# Patient Record
Sex: Male | Born: 1945 | Race: White | Hispanic: No | Marital: Married | Smoking: Never smoker
Health system: Southern US, Community
[De-identification: ages and names within clinical notes are randomized; demographics above are authoritative.]

## PROBLEM LIST (undated history)

## (undated) DIAGNOSIS — K219 Gastro-esophageal reflux disease without esophagitis: Secondary | ICD-10-CM

## (undated) DIAGNOSIS — R569 Unspecified convulsions: Secondary | ICD-10-CM

## (undated) DIAGNOSIS — I10 Essential (primary) hypertension: Secondary | ICD-10-CM

## (undated) DIAGNOSIS — E785 Hyperlipidemia, unspecified: Secondary | ICD-10-CM

## (undated) DIAGNOSIS — E119 Type 2 diabetes mellitus without complications: Secondary | ICD-10-CM

## (undated) HISTORY — PX: TONSILLECTOMY: SUR1361

---

## 2006-02-24 HISTORY — PX: CHOLECYSTECTOMY: SHX55

## 2008-08-18 ENCOUNTER — Encounter: Admission: RE | Admit: 2008-08-18 | Discharge: 2008-08-18 | Payer: Self-pay | Admitting: Neurology

## 2012-04-12 ENCOUNTER — Other Ambulatory Visit: Payer: Self-pay | Admitting: Neurology

## 2012-04-12 DIAGNOSIS — G40909 Epilepsy, unspecified, not intractable, without status epilepticus: Secondary | ICD-10-CM

## 2012-05-05 ENCOUNTER — Ambulatory Visit
Admission: RE | Admit: 2012-05-05 | Discharge: 2012-05-05 | Disposition: A | Discharge status: Home / Self Care | Payer: PRIVATE HEALTH INSURANCE | Source: Ambulatory Visit | Attending: Neurology | Admitting: Neurology

## 2012-05-05 DIAGNOSIS — G40909 Epilepsy, unspecified, not intractable, without status epilepticus: Secondary | ICD-10-CM

## 2012-11-08 ENCOUNTER — Inpatient Hospital Stay (HOSPITAL_COMMUNITY)
Admission: EM | Admit: 2012-11-08 | Discharge: 2012-11-10 | DRG: 872 | Disposition: A | Discharge status: Home / Self Care | Payer: 59 | Attending: Internal Medicine | Admitting: Internal Medicine

## 2012-11-08 ENCOUNTER — Emergency Department (HOSPITAL_COMMUNITY): Payer: 59

## 2012-11-08 ENCOUNTER — Encounter (HOSPITAL_COMMUNITY): Payer: Self-pay | Admitting: *Deleted

## 2012-11-08 DIAGNOSIS — D72829 Elevated white blood cell count, unspecified: Secondary | ICD-10-CM | POA: Diagnosis present

## 2012-11-08 DIAGNOSIS — J40 Bronchitis, not specified as acute or chronic: Secondary | ICD-10-CM | POA: Diagnosis present

## 2012-11-08 DIAGNOSIS — E872 Acidosis, unspecified: Secondary | ICD-10-CM | POA: Diagnosis present

## 2012-11-08 DIAGNOSIS — Z7982 Long term (current) use of aspirin: Secondary | ICD-10-CM

## 2012-11-08 DIAGNOSIS — R6510 Systemic inflammatory response syndrome (SIRS) of non-infectious origin without acute organ dysfunction: Secondary | ICD-10-CM | POA: Diagnosis present

## 2012-11-08 DIAGNOSIS — R000 Tachycardia, unspecified: Secondary | ICD-10-CM

## 2012-11-08 DIAGNOSIS — E119 Type 2 diabetes mellitus without complications: Secondary | ICD-10-CM | POA: Diagnosis present

## 2012-11-08 DIAGNOSIS — R0789 Other chest pain: Secondary | ICD-10-CM

## 2012-11-08 DIAGNOSIS — I498 Other specified cardiac arrhythmias: Secondary | ICD-10-CM | POA: Diagnosis present

## 2012-11-08 DIAGNOSIS — I10 Essential (primary) hypertension: Secondary | ICD-10-CM

## 2012-11-08 DIAGNOSIS — A419 Sepsis, unspecified organism: Principal | ICD-10-CM | POA: Diagnosis present

## 2012-11-08 DIAGNOSIS — Z79899 Other long term (current) drug therapy: Secondary | ICD-10-CM

## 2012-11-08 HISTORY — DX: Type 2 diabetes mellitus without complications: E11.9

## 2012-11-08 HISTORY — DX: Essential (primary) hypertension: I10

## 2012-11-08 LAB — CBC
HCT: 40.9 % (ref 39.0–52.0)
Hemoglobin: 15 g/dL (ref 13.0–17.0)
MCH: 32.3 pg (ref 26.0–34.0)
MCHC: 36.7 g/dL — ABNORMAL HIGH (ref 30.0–36.0)
Platelets: 285 10*3/uL (ref 150–400)
RBC: 4.68 MIL/uL (ref 4.22–5.81)
RBC: 4.34 MIL/uL (ref 4.22–5.81)
WBC: 21.6 10*3/uL — ABNORMAL HIGH (ref 4.0–10.5)

## 2012-11-08 LAB — TROPONIN I
Troponin I: <0.3 ng/mL (ref <0.30)
Troponin I: <0.3 ng/mL (ref <0.30)

## 2012-11-08 LAB — CREATININE, SERUM
Creatinine, Ser: 0.83 mg/dL (ref 0.50–1.35)
GFR calc non Af Amer: >90 mL/min (ref >90)

## 2012-11-08 LAB — URINALYSIS, ROUTINE W REFLEX MICROSCOPIC
Glucose, UA: 250 mg/dL — AB
Hgb urine dipstick: NEGATIVE
Ketones, ur: 40 mg/dL — AB
Protein, ur: NEGATIVE mg/dL

## 2012-11-08 LAB — MRSA PCR SCREENING: MRSA by PCR: NEGATIVE

## 2012-11-08 LAB — COMPREHENSIVE METABOLIC PANEL
Albumin: 4.2 g/dL (ref 3.5–5.2)
Alkaline Phosphatase: 80 U/L (ref 39–117)
BUN: 16 mg/dL (ref 6–23)
Chloride: 96 mEq/L (ref 96–112)
GFR calc Af Amer: >90 mL/min (ref >90)
Glucose, Bld: 208 mg/dL — ABNORMAL HIGH (ref 70–99)
Potassium: 5.1 mEq/L (ref 3.5–5.1)
Total Bilirubin: 0.4 mg/dL (ref 0.3–1.2)

## 2012-11-08 LAB — URINE MICROSCOPIC-ADD ON

## 2012-11-08 LAB — SEDIMENTATION RATE: Sed Rate: 5 mm/hr (ref 0–16)

## 2012-11-08 LAB — MAGNESIUM: Magnesium: 1.6 mg/dL (ref 1.5–2.5)

## 2012-11-08 MED ORDER — SODIUM CHLORIDE 0.9 % IV SOLN
INTRAVENOUS | Status: DC
  Administered 2012-11-08: 100 mL/h via INTRAVENOUS
  Administered 2012-11-09 – 2012-11-10 (×3): via INTRAVENOUS

## 2012-11-08 MED ORDER — GI COCKTAIL ~~LOC~~
30.0000 mL | Freq: Four times a day (QID) | ORAL | Status: DC | PRN
  Filled 2012-11-08: qty 30

## 2012-11-08 MED ORDER — FENTANYL CITRATE 0.05 MG/ML IJ SOLN
25.0000 ug | Freq: Once | INTRAMUSCULAR | Status: AC
  Administered 2012-11-08: 25 ug via INTRAVENOUS
  Filled 2012-11-08: qty 2

## 2012-11-08 MED ORDER — INSULIN ASPART 100 UNIT/ML ~~LOC~~ SOLN
0.0000 [IU] | Freq: Three times a day (TID) | SUBCUTANEOUS | Status: DC
  Administered 2012-11-08: 5 [IU] via SUBCUTANEOUS
  Administered 2012-11-09 (×2): 3 [IU] via SUBCUTANEOUS
  Administered 2012-11-10: 5 [IU] via SUBCUTANEOUS

## 2012-11-08 MED ORDER — HEPARIN SODIUM (PORCINE) 5000 UNIT/ML IJ SOLN
5000.0000 [IU] | Freq: Three times a day (TID) | INTRAMUSCULAR | Status: DC
Start: 2012-11-08 — End: 2012-11-10
  Administered 2012-11-08 – 2012-11-10 (×6): 5000 [IU] via SUBCUTANEOUS
  Filled 2012-11-08 (×9): qty 1

## 2012-11-08 MED ORDER — SODIUM CHLORIDE 0.9 % IV BOLUS (SEPSIS)
500.0000 mL | Freq: Once | INTRAVENOUS | Status: AC
  Administered 2012-11-08: 500 mL via INTRAVENOUS

## 2012-11-08 MED ORDER — SODIUM CHLORIDE 0.9 % IV BOLUS (SEPSIS)
1000.0000 mL | Freq: Once | INTRAVENOUS | Status: AC
  Administered 2012-11-08: 1000 mL via INTRAVENOUS

## 2012-11-08 MED ORDER — DEXTROSE 5 % IV SOLN
1.0000 g | Freq: Once | INTRAVENOUS | Status: AC
  Administered 2012-11-08: 1 g via INTRAVENOUS
  Filled 2012-11-08: qty 10

## 2012-11-08 MED ORDER — PANTOPRAZOLE SODIUM 40 MG PO TBEC
80.0000 mg | DELAYED_RELEASE_TABLET | Freq: Every day | ORAL | Status: DC
  Administered 2012-11-08 – 2012-11-09 (×2): 80 mg via ORAL
  Filled 2012-11-08 (×2): qty 2

## 2012-11-08 MED ORDER — VANCOMYCIN HCL IN DEXTROSE 1-5 GM/200ML-% IV SOLN
1000.0000 mg | Freq: Two times a day (BID) | INTRAVENOUS | Status: DC
  Administered 2012-11-09: 1000 mg via INTRAVENOUS
  Filled 2012-11-08 (×2): qty 200

## 2012-11-08 MED ORDER — PIPERACILLIN-TAZOBACTAM 3.375 G IVPB
3.3750 g | Freq: Three times a day (TID) | INTRAVENOUS | Status: DC
  Administered 2012-11-08 – 2012-11-09 (×2): 3.375 g via INTRAVENOUS
  Filled 2012-11-08 (×4): qty 50

## 2012-11-08 MED ORDER — ASPIRIN 81 MG PO CHEW
81.0000 mg | CHEWABLE_TABLET | Freq: Every day | ORAL | Status: DC
  Administered 2012-11-09: 81 mg via ORAL
  Filled 2012-11-08: qty 1

## 2012-11-08 MED ORDER — METOPROLOL TARTRATE 1 MG/ML IV SOLN
2.5000 mg | Freq: Once | INTRAVENOUS | Status: AC
  Administered 2012-11-08: 2.5 mg via INTRAVENOUS
  Filled 2012-11-08: qty 5

## 2012-11-08 MED ORDER — IOHEXOL 350 MG/ML SOLN
100.0000 mL | Freq: Once | INTRAVENOUS | Status: AC | PRN
  Administered 2012-11-08: 100 mL via INTRAVENOUS

## 2012-11-08 MED ORDER — MORPHINE SULFATE 2 MG/ML IJ SOLN: 2.0000 mg | INTRAMUSCULAR | Status: DC | PRN

## 2012-11-08 MED ORDER — VANCOMYCIN HCL 10 G IV SOLR
1500.0000 mg | Freq: Once | INTRAVENOUS | Status: AC
  Administered 2012-11-08: 1500 mg via INTRAVENOUS
  Filled 2012-11-08: qty 1500

## 2012-11-08 MED ORDER — ASPIRIN 81 MG PO CHEW
81.0000 mg | CHEWABLE_TABLET | Freq: Once | ORAL | Status: AC
  Administered 2012-11-08: 81 mg via ORAL
  Filled 2012-11-08: qty 1

## 2012-11-08 MED ORDER — ASPIRIN 81 MG PO TABS: 81.0000 mg | ORAL_TABLET | Freq: Every day | ORAL | Status: DC

## 2012-11-08 MED ORDER — METOPROLOL TARTRATE 25 MG PO TABS
25.0000 mg | ORAL_TABLET | Freq: Two times a day (BID) | ORAL | Status: DC
  Administered 2012-11-08: 25 mg via ORAL
  Filled 2012-11-08 (×3): qty 1

## 2012-11-08 MED ORDER — SIMVASTATIN 40 MG PO TABS
40.0000 mg | ORAL_TABLET | Freq: Every evening | ORAL | Status: DC
  Administered 2012-11-08 – 2012-11-09 (×2): 40 mg via ORAL
  Filled 2012-11-08 (×3): qty 1

## 2012-11-08 MED ORDER — ACETAMINOPHEN 325 MG PO TABS
650.0000 mg | ORAL_TABLET | ORAL | Status: DC | PRN
  Administered 2012-11-08 – 2012-11-09 (×3): 650 mg via ORAL
  Filled 2012-11-08 (×3): qty 2

## 2012-11-08 MED ORDER — LAMOTRIGINE 25 MG PO TABS
50.0000 mg | ORAL_TABLET | Freq: Two times a day (BID) | ORAL | Status: DC
  Administered 2012-11-08 – 2012-11-10 (×4): 50 mg via ORAL
  Filled 2012-11-08 (×6): qty 2

## 2012-11-08 MED ORDER — ONDANSETRON HCL 4 MG/2ML IJ SOLN: 4.0000 mg | Freq: Four times a day (QID) | INTRAMUSCULAR | Status: DC | PRN

## 2012-11-08 MED ORDER — PIPERACILLIN-TAZOBACTAM 3.375 G IVPB 30 MIN
3.3750 g | Freq: Once | INTRAVENOUS | Status: AC
  Administered 2012-11-08: 3.375 g via INTRAVENOUS
  Filled 2012-11-08: qty 50

## 2012-11-08 NOTE — ED Notes (Signed)
The pt woke up an hour ago to go to the br and started having chest tightness and some sob.  At present tachy  Skin warm and dry

## 2012-11-08 NOTE — H&P (Signed)
[facility] History and Physical  Rodric Punch WUJ:[id number] DOB: Sep 27, 1945 DOA: 11/08/2012  Referring physician: Dr. Latricia Heft PCP: No primary provider on file.  Specialists: None  Chief Complaint: Chest discomfort  HPI: Joshua Randall is a 67 y.o. male  With h/o DM and HTN. Reports that he got up to go to the bathroom this morning and had chest pain.  The chest pain runs across his chest and is characterized as chest tightness. Nothing makes it better or worse that the patient knows of.  Not exacerbated by activity.  The pain does not radiate to his jaw or his left arm.  He reports a non productive cough within the last month.  Otherwise denies any sick contacts or hemoptysis.    In the ED patient had a negative troponin, had sinus tachycardia on EKG, and met SIRs criteria with no source of infection identified. CT angiogram was negative for PE.   Review of Systems: 10 point review of system reviewed and negative unless otherwise mentioned above.  Past Medical History  Diagnosis Date  . Diabetes mellitus without complication   . Hypertension    History reviewed. No pertinent past surgical history. Social History:  reports that he has never smoked. He does not have any smokeless tobacco history on file. He reports that  drinks alcohol. His drug history is not on file.  where does patient live--home, ALF, SNF? and with whom if at home? Lives at home with wife.  Can patient participate in ADLs? yes  Not on File  No family history on file. No significant family history reported.  Prior to Admission medications   Medication Sig Start Date End Date Taking? Authorizing Provider  aspirin 81 MG tablet Take 81 mg by mouth daily.   Yes Historical Provider, MD  esomeprazole (NEXIUM) 40 MG capsule Take 40 mg by mouth at bedtime.   Yes Historical Provider, MD  felodipine (PLENDIL) 5 MG 24 hr tablet Take 5 mg by mouth 2 (two) times daily.   Yes Historical Provider, MD  glyBURIDE (DIABETA)  1.25 MG tablet Take 1.25 mg by mouth 2 (two) times daily.   Yes Historical Provider, MD  lamoTRIgine (LAMICTAL) 25 MG tablet Take 50 mg by mouth 2 (two) times daily.   Yes Historical Provider, MD  lisinopril (PRINIVIL,ZESTRIL) 20 MG tablet Take 20 mg by mouth daily.   Yes Historical Provider, MD  metFORMIN (GLUCOPHAGE) 1000 MG tablet Take 1,000 mg by mouth 2 (two) times daily.   Yes Historical Provider, MD  Multiple Vitamins-Minerals (MULTIVITAMIN PO) Take 1 tablet by mouth daily.   Yes Historical Provider, MD  OVER THE COUNTER MEDICATION Take 1 tablet by mouth 2 (two) times daily.   Yes Historical Provider, MD  simvastatin (ZOCOR) 40 MG tablet Take 40 mg by mouth every evening.   Yes Historical Provider, MD  tadalafil (CIALIS) 20 MG tablet Take 20 mg by mouth daily as needed for erectile dysfunction.   Yes Historical Provider, MD   Physical Exam: Filed Vitals:   11/08/12 1115  BP: 118/81  Pulse: 135  Temp:   Resp: 29     General:  Pt in NAD, Alert and Awake, smiling at times  Eyes: EOMI, non icteric  ENT: normal exterior appearance, dry mucous membranes  Neck: supple, no goiter  Cardiovascular: RRR elevated heart rate, no murmurs  Respiratory: CTA BL, no wheezes  Abdomen: soft, NT, ND  Skin: warm and dry  Musculoskeletal: no cyanosis or clubbing  Psychiatric: mood and affect appropriate  Neurologic: answers questions appropriately, moves all extremities equally  Labs on Admission:  Basic Metabolic Panel:  Recent Labs Lab 11/08/12 0600  NA 133*  K 5.1  CL 96  CO2 20  GLUCOSE 208*  BUN 16  CREATININE 0.99  CALCIUM 9.5  MG 1.6   Liver Function Tests:  Recent Labs Lab 11/08/12 0600  AST 49*  ALT 38  ALKPHOS 80  BILITOT 0.4  PROT 7.6  ALBUMIN 4.2   No results found for this basename: LIPASE, AMYLASE,  in the last 168 hours No results found for this basename: AMMONIA,  in the last 168 hours CBC:  Recent Labs Lab 11/08/12 0600  WBC 21.6*  HGB  15.0  HCT 40.9  MCV 87.4  PLT 315   Cardiac Enzymes:  Recent Labs Lab 11/08/12 0724  TROPONINI <0.30    BNP (last 3 results)  Recent Labs  11/08/12 0606  PROBNP 228.7*   CBG: No results found for this basename: GLUCAP,  in the last 168 hours  Radiological Exams on Admission: Ct Angio Chest W/cm &/or Wo Cm  11/08/2012   CLINICAL DATA:  67 year old male with chest tightness.  EXAM: CT ANGIOGRAPHY CHEST WITH CONTRAST  TECHNIQUE: Multidetector CT imaging of the chest was performed using the standard protocol during bolus administration of intravenous contrast. Multiplanar CT image reconstructions including MIPs were obtained to evaluate the vascular anatomy.  CONTRAST:  OMNIPAQUE IOHEXOL 350 MG/ML SOLN  COMPARISON:  None.  FINDINGS: There are no filling defects within the pulmonary arteries to suggest acute pulmonary embolism. No acute findings of the aorta. No pericardial fluid. Esophagus is normal.  No axillary or supraclavicular adenopathy. No mediastinal adenopathy.  Review of the lung windows demonstrates bibasilar medial atelectasis with some peribronchial thickening and pleural thickening.  Review of the upper abdomen is unremarkable. Small hiatal hernia is present.  Review of the MIP images confirms the above findings.  IMPRESSION: 1. No evidence acute pulmonary embolism.  2. Moderate bibasilar medial atelectasis and peribronchial thickening. Differential includes atelectasis, bronchitis, aspiration pneumonitis. Bilateral pneumonia felt unlikely.   Electronically Signed   By: Genevive Bi M.D.   On: 11/08/2012 10:39   Dg Chest Portable 1 View  11/08/2012   CLINICAL DATA:  Chest pain.  EXAM: PORTABLE CHEST - 1 VIEW  COMPARISON:  None.  FINDINGS: The heart size and mediastinal contours are within normal limits when accounting for rightward rotation. Both lungs are clear. The visualized skeletal structures are unremarkable.  IMPRESSION: No active disease.   Electronically  Signed   By: Tiburcio Pea   On: 11/08/2012 06:34    EKG: Independently reviewed. Sinus tachycardia with no ST elevation or depression  Assessment/Plan Active Problems:   Chest discomfort   Hypertension   Leukocytosis   Tachycardia   Lactic acidosis   SIRS (systemic inflammatory response syndrome)   1. SIRs - Transfer to step down unit - Place on broad spectrum antibiotics - Agree with blood cultures - Urine culture - Monitor WBC levels - normal saline at 100 ml/hr  2. Chest discomfort - Troponin x 3  - Telemetry monitoring  - morphine for severe discomfort - metoprolol, asa - EKG next am  3. Leukocytosis - ?source  - Obtain CRP and ESR  4. Tachycardia - could be 2ary to chest discomfort - Will add B blocker and continue to monitor - EKG next am - administer IVF's with normal saline.  5. Lactic acidosis - Patient is on metformin but is has  normal creatinine of 0.99 as such lactic acidosis from metformin less likely - Will administer normal saline and cover with broad spectrum antibiotics in case this is related to infectious etiology.  6. DM - hold metformin - diabetic diet - SSI  7. DVT prophylaxis -heparin   Code Status: full Family Communication: discussed with patient and family member at bedside.  Disposition Plan: To stepdown on telemetry monitoring. Should condition deteriorate would consider PCCM consult  Time spent: > 35 minutes  Penny Pia [facility] Pager [phone]  If 7PM-7AM, please contact night-coverage [email or web address] Password [facility] 11/08/2012, 11:31 AM

## 2012-11-08 NOTE — Progress Notes (Signed)
ANTIBIOTIC CONSULT NOTE - INITIAL  Pharmacy Consult for zosyn/vanc Indication: r/o sepsis  Not on File  Patient Measurements: Wt= 76kg  Vital Signs: Temp: 98.3 F (36.8 C) (09/15 0915) Temp src: Oral (09/15 0915) BP: 132/74 mmHg (09/15 1230) Pulse Rate: 122 (09/15 1230) Intake/Output from previous day:   Intake/Output from this shift: Total I/O In: -  Out: 400 [Urine:400]  Labs:  Recent Labs  11/08/12 0600  WBC 21.6*  HGB 15.0  PLT 315  CREATININE 0.99   CrCl is unknown because there is no height on file for the current visit. No results found for this basename: VANCOTROUGH, VANCOPEAK, VANCORANDOM, GENTTROUGH, GENTPEAK, GENTRANDOM, TOBRATROUGH, TOBRAPEAK, TOBRARND, AMIKACINPEAK, AMIKACINTROU, AMIKACIN,  in the last 72 hours   Microbiology: No results found for this or any previous visit (from the past 720 hour(s)).  Medical History: Past Medical History  Diagnosis Date  . Diabetes mellitus without complication   . Hypertension     Medications:  Prescriptions prior to admission  Medication Sig Dispense Refill  . aspirin 81 MG tablet Take 81 mg by mouth daily.      Marland Kitchen esomeprazole (NEXIUM) 40 MG capsule Take 40 mg by mouth at bedtime.      . felodipine (PLENDIL) 5 MG 24 hr tablet Take 5 mg by mouth 2 (two) times daily.      Marland Kitchen glyBURIDE (DIABETA) 1.25 MG tablet Take 1.25 mg by mouth 2 (two) times daily.      Marland Kitchen lamoTRIgine (LAMICTAL) 25 MG tablet Take 50 mg by mouth 2 (two) times daily.      Marland Kitchen lisinopril (PRINIVIL,ZESTRIL) 20 MG tablet Take 20 mg by mouth daily.      . metFORMIN (GLUCOPHAGE) 1000 MG tablet Take 1,000 mg by mouth 2 (two) times daily.      . Multiple Vitamins-Minerals (MULTIVITAMIN PO) Take 1 tablet by mouth daily.      Marland Kitchen OVER THE COUNTER MEDICATION Take 1 tablet by mouth 2 (two) times daily.      . simvastatin (ZOCOR) 40 MG tablet Take 40 mg by mouth every evening.      . tadalafil (CIALIS) 20 MG tablet Take 20 mg by mouth daily as needed for  erectile dysfunction.       Scheduled:  . [START ON 11/09/2012] aspirin  81 mg Oral Daily  . heparin  5,000 Units Subcutaneous Q8H  . insulin aspart  0-15 Units Subcutaneous TID WC  . lamoTRIgine  50 mg Oral BID  . metoprolol tartrate  25 mg Oral BID  . pantoprazole  80 mg Oral Q1200  . simvastatin  40 mg Oral QPM     Assessment: 67 yo male here with CP and SIRS to begin vancomycin and zosyn for empiric coverage.  WBC= 21.6, afeb, SCr= 0.99 and CrCl ~ 70 ml/min.  9/15 vanc 9/15 zosyn  9/15 urine 9/15 blood x2  Goal of Therapy:  Vancomycin trough level 15-20 mcg/ml  Plan:  -Zosyn 3.375gm IV q8h -Vancomycin 1500mg  IV x1 followed by IV 1000mg  q12hr -Will follow renal function, cultures and clinical progress  Harland German, Pharm D 11/08/2012 1:57 PM

## 2012-11-08 NOTE — ED Provider Notes (Signed)
CT scan negative for PE.  Dr Cena Benton will be coming to admit the patient.  Celene Kras, MD 11/08/12 [phone]

## 2012-11-08 NOTE — ED Notes (Signed)
Pt given fentanyl iv for pain he reports that he does not have pain just tightness.  He took the med anyway

## 2012-11-08 NOTE — Progress Notes (Signed)
Labs checked, orders checked. Noted order for istat troponins and no further lab draw for blood. Only 1 troponin noted. Contacted NP, made aware of this nurse findings. Orders received. Will continue to monitor.

## 2012-11-08 NOTE — ED Notes (Signed)
The pt arrived with pulse of 135.  Valsalva maneuver  x2 did not decrease his rate.  His chest tightness is across his upper lt and rt chest.  Wife at the bedside.  The pt remains alert

## 2012-11-08 NOTE — ED Notes (Signed)
Pulse rate after lopressor minimal  Decrease  125

## 2012-11-08 NOTE — ED Provider Notes (Signed)
CSN: [id number]     Arrival date & time 11/08/12  0542 History   First MD Initiated Contact with Patient 11/08/12 0545     Chief Complaint  Patient presents with  . Chest Pain   (Consider location/radiation/quality/duration/timing/severity/associated sxs/prior Treatment) HPI Comments: 67 year old male presents emergency department with chief complaint of chest tightness. History obtained from the patient and his wife. Patient awoke this morning at 3:00 which is his normal time to wake up and was experiencing chest tightness. He took his blood pressure at that time and it was 155/89. He walked his dogs came back into the house and felt somewhat lightheaded retook his blood pressure and it was 65/47. He sat down 3 minutes later he retook his blood pressure and it was normal. Patient has risk factors of diabetes and hypertension. He has no known cardiac disease and had a normal GXT in 2008.  Patient denies allergies to medications. Cardiac risk factors are age, diabetes, hypertension, obesity. Patient denies recent travel, history of blood clots, or other DVT risk factors.  Current medications include 2 hypertensive medications which the patient thinks are Plendil and lisinopril, metformin, glipizide, and occasional Cialis. Last dose of Cialis was greater than 24 hours ago at 0400 on September 13.  Patient denies recent change in medication, recent infection, or sick contacts.  He denies headache, neck pain, cough, nausea vomiting diarrhea, abdominal pain, extremity pain or swelling, blood in stool or urine.  Prior to leaving home the patient took 3 baby aspirin. No other treatment was given prior to arrival.  Patient denies fever, chills, headache, neck pain, cough, nausea vomiting diarrhea, abdominal pain, urinary symptoms to include frequency urgency dysuria or hematuria, rash, insect bite, or recent infection.  Patient is a 67 y.o. male presenting with chest pain. The history is provided by  the patient and the spouse.  Chest Pain Pain location:  Substernal area Pain quality: aching and tightness   Pain radiates to:  Does not radiate Pain radiates to the back: no   Pain severity:  Moderate Onset quality:  Sudden Duration:  3 hours Timing:  Constant Progression:  Unchanged Chronicity:  New Relieved by:  None tried Worsened by:  Nothing tried Ineffective treatments:  None tried Associated symptoms: dizziness   Associated symptoms: no abdominal pain, no AICD problem, no altered mental status, no anorexia, no back pain, no claudication, no cough, no diaphoresis, no fatigue, no headache, no numbness, no orthopnea, no palpitations, no PND and no shortness of breath   Risk factors: diabetes mellitus, high cholesterol and hypertension     Past Medical History  Diagnosis Date  . Diabetes mellitus without complication   . Hypertension    History reviewed. No pertinent past surgical history. No family history on file. History  Substance Use Topics  . Smoking status: Never Smoker   . Smokeless tobacco: Not on file  . Alcohol Use: Yes    Review of Systems  Constitutional: Positive for activity change. Negative for chills, diaphoresis, appetite change and fatigue.  HENT: Negative.   Eyes: Negative.   Respiratory: Positive for chest tightness. Negative for cough, choking, shortness of breath and stridor.   Cardiovascular: Positive for chest pain. Negative for palpitations, orthopnea, claudication, leg swelling and PND.  Gastrointestinal: Negative for abdominal pain, diarrhea, constipation, blood in stool, abdominal distention, anal bleeding and anorexia.  Endocrine: Negative.   Genitourinary: Negative.  Negative for dysuria and frequency.  Musculoskeletal: Negative for back pain, joint swelling, arthralgias and gait problem.  Neurological: Positive for dizziness. Negative for light-headedness, numbness and headaches.       Brief period of dizziness during which time his  blood pressure was 65/47 on at home monitor  Hematological: Negative.   Psychiatric/Behavioral: Negative.     Allergies  Review of patient's allergies indicates not on file.  Home Medications   Current Outpatient Rx  Name  Route  Sig  Dispense  Refill  . aspirin 81 MG tablet   Oral   Take 81 mg by mouth daily.         Marland Kitchen esomeprazole (NEXIUM) 40 MG capsule   Oral   Take 40 mg by mouth at bedtime.         . felodipine (PLENDIL) 5 MG 24 hr tablet   Oral   Take 5 mg by mouth 2 (two) times daily.         Marland Kitchen glyBURIDE (DIABETA) 1.25 MG tablet   Oral   Take 1.25 mg by mouth 2 (two) times daily.         Marland Kitchen lamoTRIgine (LAMICTAL) 25 MG tablet   Oral   Take 50 mg by mouth 2 (two) times daily.         Marland Kitchen lisinopril (PRINIVIL,ZESTRIL) 20 MG tablet   Oral   Take 20 mg by mouth daily.         . metFORMIN (GLUCOPHAGE) 1000 MG tablet   Oral   Take 1,000 mg by mouth 2 (two) times daily.         . Multiple Vitamins-Minerals (MULTIVITAMIN PO)   Oral   Take 1 tablet by mouth daily.         Marland Kitchen OVER THE COUNTER MEDICATION   Oral   Take 1 tablet by mouth 2 (two) times daily.         . simvastatin (ZOCOR) 40 MG tablet   Oral   Take 40 mg by mouth every evening.         . tadalafil (CIALIS) 20 MG tablet   Oral   Take 20 mg by mouth daily as needed for erectile dysfunction.          BP 143/80  Pulse 120  Temp(Src) 99.4 F (37.4 C) (Rectal)  SpO2 95% Physical Exam  Constitutional: He is oriented to person, place, and time. He appears well-developed and well-nourished.  HENT:  Head: Normocephalic and atraumatic.  Eyes: Conjunctivae are normal. Pupils are equal, round, and reactive to light.  Neck: Normal range of motion. Neck supple. No JVD present. No tracheal deviation present. No thyromegaly present.  Cardiovascular: Normal heart sounds, intact distal pulses and normal pulses.  Tachycardia present.  Exam reveals no decreased pulses.   Pulmonary/Chest:  Effort normal and breath sounds normal. No respiratory distress. He has no wheezes. He has no rales.  Abdominal: Soft. Bowel sounds are normal. He exhibits no distension and no mass. There is no tenderness. There is no rebound and no guarding.  Genitourinary: Rectum normal. Prostate is enlarged. No penile tenderness.  No gross blood on rectal. Rectal temp 99.6.  Musculoskeletal: He exhibits no edema and no tenderness.  Neurological: He is alert and oriented to person, place, and time. He has normal reflexes. Coordination normal.  Skin: Skin is warm and dry. No rash noted. No erythema. No pallor.    ED Course  Procedures (including critical care time) Labs ReviewImaging Review Results for orders placed during the hospital encounter of 11/08/12  CBC      Result Value Range  WBC 21.6 (*) 4.0 - 10.5 K/uL   RBC 4.68  4.22 - 5.81 MIL/uL   Hemoglobin 15.0  13.0 - 17.0 g/dL   HCT 16.1  09.6 - 04.5 %   MCV 87.4  78.0 - 100.0 fL   MCH 32.1  26.0 - 34.0 pg   MCHC 36.7 (*) 30.0 - 36.0 g/dL   RDW 40.9  81.1 - 91.4 %   Platelets 315  150 - 400 K/uL  COMPREHENSIVE METABOLIC PANEL      Result Value Range   Sodium 133 (*) 135 - 145 mEq/L   Potassium 5.1  3.5 - 5.1 mEq/L   Chloride 96  96 - 112 mEq/L   CO2 20  19 - 32 mEq/L   Glucose, Bld 208 (*) 70 - 99 mg/dL   BUN 16  6 - 23 mg/dL   Creatinine, Ser 7.82  0.50 - 1.35 mg/dL   Calcium 9.5  8.4 - 95.6 mg/dL   Total Protein 7.6  6.0 - 8.3 g/dL   Albumin 4.2  3.5 - 5.2 g/dL   AST 49 (*) 0 - 37 U/L   ALT 38  0 - 53 U/L   Alkaline Phosphatase 80  39 - 117 U/L   Total Bilirubin 0.4  0.3 - 1.2 mg/dL   GFR calc non Af Amer 84 (*) >90 mL/min   GFR calc Af Amer >90  >90 mL/min  MAGNESIUM      Result Value Range   Magnesium 1.6  1.5 - 2.5 mg/dL  PRO B NATRIURETIC PEPTIDE      Result Value Range   Pro B Natriuretic peptide (BNP) 228.7 (*) 0 - 125 pg/mL  URINALYSIS, ROUTINE W REFLEX MICROSCOPIC      Result Value Range   Color, Urine YELLOW  YELLOW    APPearance CLEAR  CLEAR   Specific Gravity, Urine 1.010  1.005 - 1.030   pH 6.0  5.0 - 8.0   Glucose, UA 250 (*) NEGATIVE mg/dL   Hgb urine dipstick NEGATIVE  NEGATIVE   Bilirubin Urine NEGATIVE  NEGATIVE   Ketones, ur 40 (*) NEGATIVE mg/dL   Protein, ur NEGATIVE  NEGATIVE mg/dL   Urobilinogen, UA 0.2  0.0 - 1.0 mg/dL   Nitrite NEGATIVE  NEGATIVE   Leukocytes, UA TRACE (*) NEGATIVE  URINE MICROSCOPIC-ADD ON      Result Value Range   WBC, UA 0-2  <3 WBC/hpf   RBC / HPF 0-2  <3 RBC/hpf  POCT I-STAT TROPONIN I      Result Value Range   Troponin i, poc 0.00  0.00 - 0.08 ng/mL   Comment 3              Dg Chest Portable 1 View  11/08/2012   CLINICAL DATA:  Chest pain.  EXAM: PORTABLE CHEST - 1 VIEW  COMPARISON:  None.  FINDINGS: The heart size and mediastinal contours are within normal limits when accounting for rightward rotation. Both lungs are clear. The visualized skeletal structures are unremarkable.  IMPRESSION: No active disease.   Electronically Signed   By: Tiburcio Pea   On: 11/08/2012 06:34    Date: 11/08/2012 @ 0547  Rate: 139  Rhythm: sinus tachycardia  QRS Axis: left  Intervals: QT prolonged  ST/T Wave abnormalities: nonspecific ST changes  Conduction Disutrbances:none  Narrative Interpretation: RAD, Prolonged QTc 616, STS changes lat leads; Sinus tachcardia  Old EKG Reviewed: none available   Date: 11/08/2012 @ 0621 after Loperessor 2.5mg   Rate:  118  Rhythm: sinus tachycardia  QRS Axis: left  Intervals: normal  ST/T Wave abnormalities: nonspecific ST changes  Conduction Disutrbances:none  Narrative Interpretation: Sinus tachycardia improved, nonspecific Twave changes ad STS changes, QTc now 477  Old EKG Reviewed: changes noted     MDM  No diagnosis found. 67 year old white male presents emergency department with chief complaint of chest tightness which began acutely at 3:30 AM.  Patient presented with tachycardia heart rate at 139 beats per minutes.  Patient does not have a history of tachycardia. Initial EKG showed nonspecific ST changes. Stat O2, IV, monitor placed. IV bolus initiated. Low dose Lopressor given 2.5 mg which resulted in a mild improvement in heart rate. Second EKG obtained which revealed an improvement in heart rate and prolonged QT interval, but still had evidence of nonspecific ST segment changes not needing meeting STEMI criteria.  Stat troponin was negative. BNP negative. Chest x-ray revealed no acute disease.  Of note the patient's white cell count was 21.6 and his rectal temp was 99.6 and his lactate was 3.3. His urinalysis is negative for signs of infection and his chest x-ray is negative for pneumonia. Upon further inquiry the patient denies recent fever, chills, infection of any sort. His only recent medical issue was recently finding out that his PSA was elevated and he has a urology appointment.  8:21 AM BP 122/71  Pulse 117  Temp(Src) 99.4 F (37.4 C) (Rectal)  Resp 24  SpO2 97% Patient is feeling somewhat better. His heart rate is now 117 after IV fluids have been given.  At this point there is no evidence the patient is having a STEMI, his i-STAT troponin is negative. A formal troponin is pending. Based on tachycardia, white cell count of 21,000, I will give 1 dose of Rocephin to cover for possible infection. I will consult internal medicine to admit the patient for further workup.  Pulmonary embolism is in the differential diagnosis. However discussed this with internal medicine.  I spoke with Internal Medicine Dr. Penny Pia who requests a Chest CTA to assess for PE.  Upon completion of study the ongoing EDP (Dr. Lynelle Doctor) will re-page Dr. Cena Benton.  At 0830 pt t/o to Dr. Lynelle Doctor pending CTA and discussion with Dr. Cena Benton.         Darlys Gales, MD 11/08/12 [phone]

## 2012-11-08 NOTE — ED Notes (Signed)
Lopressor given iv

## 2012-11-08 NOTE — ED Notes (Signed)
Dr. Redgie Grayer at bedside to speak with patient.

## 2012-11-09 LAB — CBC
HCT: 40.4 % (ref 39.0–52.0)
MCV: 88 fL (ref 78.0–100.0)
RBC: 4.59 MIL/uL (ref 4.22–5.81)
WBC: 12.7 10*3/uL — ABNORMAL HIGH (ref 4.0–10.5)

## 2012-11-09 LAB — TROPONIN I
Troponin I: <0.3 ng/mL (ref <0.30)
Troponin I: <0.3 ng/mL (ref <0.30)

## 2012-11-09 LAB — GLUCOSE, CAPILLARY
Glucose-Capillary: 182 mg/dL — ABNORMAL HIGH (ref 70–99)
Glucose-Capillary: 162 mg/dL — ABNORMAL HIGH (ref 70–99)

## 2012-11-09 MED ORDER — FELODIPINE ER 5 MG PO TB24
5.0000 mg | ORAL_TABLET | Freq: Two times a day (BID) | ORAL | Status: DC
  Administered 2012-11-09 – 2012-11-10 (×3): 5 mg via ORAL
  Filled 2012-11-09 (×4): qty 1

## 2012-11-09 MED ORDER — PANTOPRAZOLE SODIUM 40 MG PO TBEC: 40.0000 mg | DELAYED_RELEASE_TABLET | Freq: Every day | ORAL | Status: DC

## 2012-11-09 MED ORDER — AZITHROMYCIN 500 MG PO TABS
500.0000 mg | ORAL_TABLET | Freq: Every day | ORAL | Status: DC
  Administered 2012-11-09 – 2012-11-10 (×2): 500 mg via ORAL
  Filled 2012-11-09 (×2): qty 1

## 2012-11-09 NOTE — Progress Notes (Signed)
Utilization review completed. Marko Skalski, RN, BSN. 

## 2012-11-09 NOTE — Progress Notes (Signed)
[facility] Progress Note [place] TEAM 1 - Stepdown/ICU TEAM   [facility] JYN:[id number] DOB: Aug 10, 1945 DOA: 11/08/2012 PCP: Joycelyn Rua, MD  Brief narrative: 67 y/o male admited for chest tightness across the upper chest. He states it occurred when he woke up at 3 AM. He took his dogs out for a walk and it continued but did not worsen. He came to the ER and received Aspirin and then Tylenol. The pain continued until about 2 AM.   Assessment/Plan: Active Problems:   Chest discomfort - has ruled out for MI with 3 sets of cardiac enzymes- etiology of the pain is still uncertain- possibly respiratory?? And improved after antibiotics?? Noted to have peribronchial thickening on CT chest    Tachycardia - this is concerning- at baseline he states his HR is in 80s. - Will resume Plendil and see if it helps it to resolved - have ordered thyroid studies - It is noted that his orthostatic vitals are slightly positive in regards to BP only and he is being hydrated via IVF - he however states he has been eating, drinking and urinating appropriately.    Hypertension - resume Plendil but hold Lisinopil due to possible dehydration    Leukocytosis/  SIRS  - had had a cold for a month and "congestion" pointing to his head. Nasal passages are open, no postnasal drip or ear heaviness - I suspect he has a bronchitis -Started on vanc and Zosyn- also given Rocephin in the ER.  - blood culture negative- Will narrow down to Azithromycin.      Lactic acidosis - recheck in AM      Code Status: full code Family Communication: none Disposition Plan: transfer to telem  Consultants: none  Procedures: none  Antibiotics: Vanc/ Zosyn- 6/15- 6/16 Rocephin 6/15 Azithromycin  DVT prophylaxis: Heparin  HPI/Subjective: Pt states he feels well- chest "tightness" resolved at 2 AM and no recurrence   Objective: Blood pressure 131/92, pulse 120, temperature 99 F (37.2 C), temperature  source Oral, resp. rate 22, height 5\' 9"  (1.753 m), weight 87.2 kg (192 lb 3.9 oz), SpO2 92.00%.  Intake/Output Summary (Last 24 hours) at 11/09/12 1750 Last data filed at 11/09/12 1500  Gross per 24 hour  Intake   2900 ml  Output   2325 ml  Net    575 ml     Exam: General: No acute respiratory distress Lungs: Clear to auscultation bilaterally without wheezes or crackles Cardiovascular: Regular rate and rhythm without murmur gallop or rub normal S1 and S2 Abdomen: Nontender, nondistended, soft, bowel sounds positive, no rebound, no ascites, no appreciable mass Extremities: No significant cyanosis, clubbing, or edema bilateral lower extremities  Data Reviewed: Basic Metabolic Panel:  Recent Labs Lab 11/08/12 0600 11/08/12 1549  NA 133*  --   K 5.1  --   CL 96  --   CO2 20  --   GLUCOSE 208*  --   BUN 16  --   CREATININE 0.99 0.83  CALCIUM 9.5  --   MG 1.6  --    Liver Function Tests:  Recent Labs Lab 11/08/12 0600  AST 49*  ALT 38  ALKPHOS 80  BILITOT 0.4  PROT 7.6  ALBUMIN 4.2   No results found for this basename: LIPASE, AMYLASE,  in the last 168 hours No results found for this basename: AMMONIA,  in the last 168 hours CBC:  Recent Labs Lab 11/08/12 0600 11/08/12 1549 11/09/12 0910  WBC 21.6* 16.6* 12.7*  HGB 15.0 14.0 14.9  HCT 40.9 38.1* 40.4  MCV 87.4 87.8 88.0  PLT 315 285 290   Cardiac Enzymes:  Recent Labs Lab 11/08/12 0724 11/08/12 2112 11/09/12 0250 11/09/12 0910  TROPONINI <0.30 <0.30 <0.30 <0.30   BNP (last 3 results)  Recent Labs  11/08/12 0606  PROBNP 228.7*   CBG:  Recent Labs Lab 11/08/12 1704 11/08/12 2028 11/09/12 0801 11/09/12 1207 11/09/12 1649  GLUCAP 215* 177* 182* 162* 208*    Recent Results (from the past 240 hour(s))  CULTURE, BLOOD (ROUTINE X 2)     Status: None   Collection Time    11/08/12  7:30 AM      Result Value Range Status   Specimen Description BLOOD LEFT ANTECUBITAL   Final   Special  Requests BOTTLES DRAWN AEROBIC ONLY   Final   Culture  Setup Time     Final   Value: 11/08/2012 14:49     Performed at [facility]   Culture     Final   Value:        BLOOD CULTURE RECEIVED NO GROWTH TO DATE CULTURE WILL BE HELD FOR 5 DAYS BEFORE ISSUING A FINAL NEGATIVE REPORT     Performed at [facility]   Report Status PENDING   Incomplete  CULTURE, BLOOD (ROUTINE X 2)     Status: None   Collection Time    11/08/12  7:40 AM      Result Value Range Status   Specimen Description BLOOD LEFT ANTECUBITAL   Final   Special Requests     Final   Value: BOTTLES DRAWN AEROBIC AND ANAEROBIC BLUE RED   Culture  Setup Time     Final   Value: 11/08/2012 14:49     Performed at [facility]   Culture     Final   Value:        BLOOD CULTURE RECEIVED NO GROWTH TO DATE CULTURE WILL BE HELD FOR 5 DAYS BEFORE ISSUING A FINAL NEGATIVE REPORT     Performed at [facility]   Report Status PENDING   Incomplete  MRSA PCR SCREENING     Status: None   Collection Time    11/08/12  1:39 PM      Result Value Range Status   MRSA by PCR NEGATIVE  NEGATIVE Final   Comment:            The GeneXpert MRSA Assay (FDA     approved for NASAL specimens     only), is one component of a     comprehensive MRSA colonization     surveillance program. It is not     intended to diagnose MRSA     infection nor to guide or     monitor treatment for     MRSA infections.     Studies:  Recent x-ray studies have been reviewed in detail by the Attending Physician  Scheduled Meds:  Scheduled Meds: . aspirin  81 mg Oral Daily  . felodipine  5 mg Oral BID  . heparin  5,000 Units Subcutaneous Q8H  . insulin aspart  0-15 Units Subcutaneous TID WC  . lamoTRIgine  50 mg Oral BID  . pantoprazole  80 mg Oral Q1200  . simvastatin  40 mg Oral QPM   Continuous Infusions: . sodium chloride 100 mL/hr at 11/09/12 0032    Time spent on care of this patient: 35  min   Irva Loser  [facility] Office  [phone] Pager - Text Page per Loretha Stapler as per below:  On-Call/Text Page:      [email or web address]      password [email or web address]  If 7PM-7AM, please contact night-coverage [email or web address] Password [email or web address] 11/09/2012, 5:50 PM   LOS: 1 day

## 2012-11-10 DIAGNOSIS — E119 Type 2 diabetes mellitus without complications: Secondary | ICD-10-CM

## 2012-11-10 LAB — BASIC METABOLIC PANEL
GFR calc Af Amer: >90 mL/min (ref >90)
GFR calc non Af Amer: >90 mL/min (ref >90)
Potassium: 3.7 mEq/L (ref 3.5–5.1)
Sodium: 137 mEq/L (ref 135–145)

## 2012-11-10 LAB — LACTIC ACID, PLASMA: Lactic Acid, Venous: 1.3 mmol/L (ref 0.5–2.2)

## 2012-11-10 LAB — GLUCOSE, CAPILLARY: Glucose-Capillary: 210 mg/dL — ABNORMAL HIGH (ref 70–99)

## 2012-11-10 LAB — URINE CULTURE

## 2012-11-10 LAB — CBC
Platelets: 305 10*3/uL (ref 150–400)
RBC: 4.8 MIL/uL (ref 4.22–5.81)
WBC: 11.2 10*3/uL — ABNORMAL HIGH (ref 4.0–10.5)

## 2012-11-10 MED ORDER — AZITHROMYCIN 500 MG PO TABS: 500.0000 mg | ORAL_TABLET | Freq: Every day | ORAL | Status: DC

## 2012-11-10 NOTE — Discharge Summary (Signed)
Physician Discharge Summary  [facility] ZOX:[id number] DOB: 06-05-1945 DOA: 11/08/2012  PCP: Joycelyn Rua, MD  Admit date: 11/08/2012 Discharge date: 11/10/2012  Time spent: 35 minutes   Discharge Diagnoses:  Active Problems:   Chest discomfort   Hypertension   Leukocytosis   Tachycardia   Lactic acidosis   SIRS (systemic inflammatory response syndrome)   Diabetes   Discharge Condition: improved  Diet recommendation: cardiac/diabetic  Filed Weights   11/08/12 1330  Weight: 87.2 kg (192 lb 3.9 oz)    History of present illness:  Joshua Randall is a 67 y.o. male  With h/o DM and HTN. Reports that he got up to go to the bathroom this morning and had chest pain. The chest pain runs across his chest and is characterized as chest tightness. Nothing makes it better or worse that the patient knows of. Not exacerbated by activity. The pain does not radiate to his jaw or his left arm. He reports a non productive cough within the last month. Otherwise denies any sick contacts or hemoptysis.  In the ED patient had a negative troponin, had sinus tachycardia on EKG, and met SIRs criteria with no source of infection identified. CT angiogram was negative for PE.    Hospital Course:  Chest discomfort  - has ruled out for MI with 3 sets of cardiac enzymes- prob respiratory  Tachycardia  - this is concerning- at baseline he states his HR is in 80s.  - Will resume Plendil and see if it helps it to resolved  - TSH ok - It is noted that his orthostatic vitals are slightly positive in regards to BP only and he is being hydrated via IVF - he however states he has been eating, drinking and urinating appropriately.   Hypertension  - resume Plendil but hold Lisinopil as BP controlled  Leukocytosis/ SIRS  - had had a cold for a month and "congestion" pointing to his head. Nasal passages are open, no postnasal drip or ear heaviness  - I suspect he has a bronchitis  -Started on vanc and Zosyn-  also given Rocephin in the ER.  - blood culture negative- narrowed down to Azithromycin.   Lactic acidosis  -normal   Procedures:    Consultations:    Discharge Exam: Filed Vitals:   11/10/12 1047  BP: 148/85  Pulse: 112  Temp:   Resp:     General: NAD- anxious to go home Cardiovascular: rrr Respiratory: clear anterior  Discharge Instructions      Discharge Orders   Future Appointments Provider Department Dept Phone   03/28/2013 3:00 PM Huston Foley, MD [facility] [phone]   Future Orders Complete By Expires   Diet - low sodium heart healthy  As directed    Diet Carb Modified  As directed    Discharge instructions  As directed    Comments:     Drink at least 8 glasses of water/day   Increase activity slowly  As directed        Medication List    STOP taking these medications       lisinopril 20 MG tablet  Commonly known as:  PRINIVIL,ZESTRIL      TAKE these medications       aspirin 81 MG tablet  Take 81 mg by mouth daily.     azithromycin 500 MG tablet  Commonly known as:  ZITHROMAX  Take 1 tablet (500 mg total) by mouth daily.     esomeprazole 40 MG capsule  Commonly known as:  NEXIUM  Take 40 mg by mouth at bedtime.     felodipine 5 MG 24 hr tablet  Commonly known as:  PLENDIL  Take 5 mg by mouth 2 (two) times daily.     glyBURIDE 1.25 MG tablet  Commonly known as:  DIABETA  Take 1.25 mg by mouth 2 (two) times daily.     lamoTRIgine 25 MG tablet  Commonly known as:  LAMICTAL  Take 50 mg by mouth 2 (two) times daily.     metFORMIN 1000 MG tablet  Commonly known as:  GLUCOPHAGE  Take 1,000 mg by mouth 2 (two) times daily.     MULTIVITAMIN PO  Take 1 tablet by mouth daily.     OVER THE COUNTER MEDICATION  Take 1 tablet by mouth 2 (two) times daily.     simvastatin 40 MG tablet  Commonly known as:  ZOCOR  Take 40 mg by mouth every evening.     tadalafil 20 MG tablet  Commonly known as:  CIALIS  Take 20  mg by mouth daily as needed for erectile dysfunction.       Not on File Follow-up Information   Follow up with Joycelyn Rua, MD. (as needed)    Specialty:  Family Medicine   Contact information:   [address] [place] [address] [phone]        The results of significant diagnostics from this hospitalization (including imaging, microbiology, ancillary and laboratory) are listed below for reference.    Significant Diagnostic Studies: Ct Angio Chest W/cm &/or Wo Cm  11/08/2012   CLINICAL DATA:  67 year old male with chest tightness.  EXAM: CT ANGIOGRAPHY CHEST WITH CONTRAST  TECHNIQUE: Multidetector CT imaging of the chest was performed using the standard protocol during bolus administration of intravenous contrast. Multiplanar CT image reconstructions including MIPs were obtained to evaluate the vascular anatomy.  CONTRAST:  OMNIPAQUE IOHEXOL 350 MG/ML SOLN  COMPARISON:  None.  FINDINGS: There are no filling defects within the pulmonary arteries to suggest acute pulmonary embolism. No acute findings of the aorta. No pericardial fluid. Esophagus is normal.  No axillary or supraclavicular adenopathy. No mediastinal adenopathy.  Review of the lung windows demonstrates bibasilar medial atelectasis with some peribronchial thickening and pleural thickening.  Review of the upper abdomen is unremarkable. Small hiatal hernia is present.  Review of the MIP images confirms the above findings.  IMPRESSION: 1. No evidence acute pulmonary embolism.  2. Moderate bibasilar medial atelectasis and peribronchial thickening. Differential includes atelectasis, bronchitis, aspiration pneumonitis. Bilateral pneumonia felt unlikely.   Electronically Signed   By: Genevive Bi M.D.   On: 11/08/2012 10:39   Dg Chest Portable 1 View  11/08/2012   CLINICAL DATA:  Chest pain.  EXAM: PORTABLE CHEST - 1 VIEW  COMPARISON:  None.  FINDINGS: The heart size and mediastinal contours are within normal  limits when accounting for rightward rotation. Both lungs are clear. The visualized skeletal structures are unremarkable.  IMPRESSION: No active disease.   Electronically Signed   By: Tiburcio Pea   On: 11/08/2012 06:34    Microbiology: Recent Results (from the past 240 hour(s))  CULTURE, BLOOD (ROUTINE X 2)     Status: None   Collection Time    11/08/12  7:30 AM      Result Value Range Status   Specimen Description BLOOD LEFT ANTECUBITAL   Final   Special Requests BOTTLES DRAWN AEROBIC ONLY   Final  Culture  Setup Time     Final   Value: 11/08/2012 14:49     Performed at [facility]   Culture     Final   Value:        BLOOD CULTURE RECEIVED NO GROWTH TO DATE CULTURE WILL BE HELD FOR 5 DAYS BEFORE ISSUING A FINAL NEGATIVE REPORT     Performed at [facility]   Report Status PENDING   Incomplete  CULTURE, BLOOD (ROUTINE X 2)     Status: None   Collection Time    11/08/12  7:40 AM      Result Value Range Status   Specimen Description BLOOD LEFT ANTECUBITAL   Final   Special Requests     Final   Value: BOTTLES DRAWN AEROBIC AND ANAEROBIC BLUE RED   Culture  Setup Time     Final   Value: 11/08/2012 14:49     Performed at [facility]   Culture     Final   Value:        BLOOD CULTURE RECEIVED NO GROWTH TO DATE CULTURE WILL BE HELD FOR 5 DAYS BEFORE ISSUING A FINAL NEGATIVE REPORT     Performed at [facility]   Report Status PENDING   Incomplete  MRSA PCR SCREENING     Status: None   Collection Time    11/08/12  1:39 PM      Result Value Range Status   MRSA by PCR NEGATIVE  NEGATIVE Final   Comment:            The GeneXpert MRSA Assay (FDA     approved for NASAL specimens     only), is one component of a     comprehensive MRSA colonization     surveillance program. It is not     intended to diagnose MRSA     infection nor to guide or     monitor treatment for     MRSA infections.  URINE CULTURE     Status: None   Collection  Time    11/09/12 12:01 AM      Result Value Range Status   Specimen Description URINE, CLEAN CATCH   Final   Special Requests NONE   Final   Culture  Setup Time     Final   Value: 11/09/2012 02:05     Performed at [facility] Count     Final   Value: NO GROWTH     Performed at [facility]   Culture     Final   Value: NO GROWTH     Performed at [facility]   Report Status 11/10/2012 FINAL   Final     Labs: Basic Metabolic Panel:  Recent Labs Lab 11/08/12 0600 11/08/12 1549 11/10/12 0635  NA 133*  --  137  K 5.1  --  3.7  CL 96  --  101  CO2 20  --  22  GLUCOSE 208*  --  204*  BUN 16  --  7  CREATININE 0.99 0.83 0.76  CALCIUM 9.5  --  9.6  MG 1.6  --   --    Liver Function Tests:  Recent Labs Lab 11/08/12 0600  AST 49*  ALT 38  ALKPHOS 80  BILITOT 0.4  PROT 7.6  ALBUMIN 4.2   No results found for this basename: LIPASE, AMYLASE,  in the last 168 hours No results found for this  basename: AMMONIA,  in the last 168 hours CBC:  Recent Labs Lab 11/08/12 0600 11/08/12 1549 11/09/12 0910 11/10/12 0635  WBC 21.6* 16.6* 12.7* 11.2*  HGB 15.0 14.0 14.9 15.3  HCT 40.9 38.1* 40.4 41.9  MCV 87.4 87.8 88.0 87.3  PLT 315 285 290 305   Cardiac Enzymes:  Recent Labs Lab 11/08/12 0724 11/08/12 2112 11/09/12 0250 11/09/12 0910  TROPONINI <0.30 <0.30 <0.30 <0.30   BNP: BNP (last 3 results)  Recent Labs  11/08/12 0606  PROBNP 228.7*   CBG:  Recent Labs Lab 11/08/12 2028 11/09/12 0801 11/09/12 1207 11/09/12 1649 11/10/12 0625  GLUCAP 177* 182* 162* 208* 210*       Signed:  VANN, JESSICA  [facility] 11/10/2012, 1:34 PM

## 2012-11-10 NOTE — Care Management Note (Signed)
    Page 1 of 1   11/10/2012     1:09:42 PM   CARE MANAGEMENT NOTE 11/10/2012  Patient:  Joshua Randall,Joshua Randall   Account Number:  [id number]  Date Initiated:  11/10/2012  Documentation initiated by:  Allysen Lazo  Subjective/Objective Assessment:   PT ADM ON 11/08/12 WITH CHEST PAIN.  PTA, PT INDEPENDENT, LIVES WITH SPOUSE.     Action/Plan:   NO DISCHARGE NEEDS IDENTIFIED.  FOR DC HOME TODAY.   Anticipated DC Date:  11/10/2012   Anticipated DC Plan:  HOME/SELF CARE      DC Planning Services  CM consult      Choice offered to / List presented to:             Status of service:  Completed, signed off Medicare Important Message given?   (If response is "NO", the following Medicare IM given date fields will be blank) Date Medicare IM given:   Date Additional Medicare IM given:    Discharge Disposition:  HOME/SELF CARE  Per UR Regulation:  Reviewed for med. necessity/level of care/duration of stay  If discussed at Long Length of Stay Meetings, dates discussed:    Comments:

## 2012-11-10 NOTE — Progress Notes (Signed)
Ok for d/c home per Dr. Benjamine Mola. Discharge instructions along with med list and script provided. IVs d/c'd with catheters intact. Awaiting transport to lobby for d/c home.Mamie Levers

## 2012-11-14 LAB — CULTURE, BLOOD (ROUTINE X 2): Culture: NO GROWTH

## 2012-11-19 ENCOUNTER — Inpatient Hospital Stay (HOSPITAL_COMMUNITY)
Admission: EM | Admit: 2012-11-19 | Discharge: 2012-11-22 | DRG: 314 | Disposition: A | Discharge status: Home / Self Care | Payer: 59 | Attending: Internal Medicine | Admitting: Internal Medicine

## 2012-11-19 ENCOUNTER — Emergency Department (HOSPITAL_COMMUNITY): Payer: 59

## 2012-11-19 ENCOUNTER — Encounter (HOSPITAL_COMMUNITY): Payer: Self-pay

## 2012-11-19 DIAGNOSIS — E119 Type 2 diabetes mellitus without complications: Secondary | ICD-10-CM | POA: Diagnosis present

## 2012-11-19 DIAGNOSIS — E785 Hyperlipidemia, unspecified: Secondary | ICD-10-CM | POA: Diagnosis present

## 2012-11-19 DIAGNOSIS — Z7982 Long term (current) use of aspirin: Secondary | ICD-10-CM

## 2012-11-19 DIAGNOSIS — J189 Pneumonia, unspecified organism: Secondary | ICD-10-CM

## 2012-11-19 DIAGNOSIS — D473 Essential (hemorrhagic) thrombocythemia: Secondary | ICD-10-CM | POA: Diagnosis present

## 2012-11-19 DIAGNOSIS — E872 Acidosis, unspecified: Secondary | ICD-10-CM | POA: Diagnosis present

## 2012-11-19 DIAGNOSIS — K219 Gastro-esophageal reflux disease without esophagitis: Secondary | ICD-10-CM | POA: Diagnosis present

## 2012-11-19 DIAGNOSIS — I10 Essential (primary) hypertension: Secondary | ICD-10-CM | POA: Diagnosis present

## 2012-11-19 DIAGNOSIS — A419 Sepsis, unspecified organism: Secondary | ICD-10-CM

## 2012-11-19 DIAGNOSIS — D72829 Elevated white blood cell count, unspecified: Secondary | ICD-10-CM

## 2012-11-19 DIAGNOSIS — R0789 Other chest pain: Secondary | ICD-10-CM | POA: Diagnosis present

## 2012-11-19 DIAGNOSIS — N179 Acute kidney failure, unspecified: Secondary | ICD-10-CM | POA: Diagnosis present

## 2012-11-19 DIAGNOSIS — R000 Tachycardia, unspecified: Secondary | ICD-10-CM | POA: Diagnosis present

## 2012-11-19 DIAGNOSIS — I309 Acute pericarditis, unspecified: Principal | ICD-10-CM | POA: Diagnosis present

## 2012-11-19 DIAGNOSIS — R079 Chest pain, unspecified: Secondary | ICD-10-CM

## 2012-11-19 DIAGNOSIS — I313 Pericardial effusion (noninflammatory): Secondary | ICD-10-CM

## 2012-11-19 HISTORY — DX: Unspecified convulsions: R56.9

## 2012-11-19 HISTORY — DX: Hyperlipidemia, unspecified: E78.5

## 2012-11-19 HISTORY — DX: Gastro-esophageal reflux disease without esophagitis: K21.9

## 2012-11-19 LAB — PHOSPHORUS: Phosphorus: 3.7 mg/dL (ref 2.3–4.6)

## 2012-11-19 LAB — PRO B NATRIURETIC PEPTIDE: Pro B Natriuretic peptide (BNP): 108.8 pg/mL (ref 0–125)

## 2012-11-19 LAB — CBC WITH DIFFERENTIAL/PLATELET
Basophils Relative: 0 % (ref 0–1)
Hemoglobin: 14.5 g/dL (ref 13.0–17.0)
Lymphs Abs: 1.1 10*3/uL (ref 0.7–4.0)
MCHC: 35.5 g/dL (ref 30.0–36.0)
Monocytes Relative: 4 % (ref 3–12)
Neutro Abs: 21 10*3/uL — ABNORMAL HIGH (ref 1.7–7.7)
Neutrophils Relative %: 91 % — ABNORMAL HIGH (ref 43–77)
RBC: 4.61 MIL/uL (ref 4.22–5.81)

## 2012-11-19 LAB — BASIC METABOLIC PANEL
CO2: 24 mEq/L (ref 19–32)
Calcium: 9.9 mg/dL (ref 8.4–10.5)
Creatinine, Ser: 1.38 mg/dL — ABNORMAL HIGH (ref 0.50–1.35)
Glucose, Bld: 223 mg/dL — ABNORMAL HIGH (ref 70–99)
Sodium: 135 mEq/L (ref 135–145)

## 2012-11-19 LAB — CBC
HCT: 35.4 % — ABNORMAL LOW (ref 39.0–52.0)
Hemoglobin: 13 g/dL (ref 13.0–17.0)
MCH: 32.3 pg (ref 26.0–34.0)
MCHC: 36.7 g/dL — ABNORMAL HIGH (ref 30.0–36.0)
MCV: 88.1 fL (ref 78.0–100.0)
RBC: 4.02 MIL/uL — ABNORMAL LOW (ref 4.22–5.81)
RDW: 13.2 % (ref 11.5–15.5)

## 2012-11-19 LAB — HIV ANTIBODY (ROUTINE TESTING W REFLEX): HIV: NONREACTIVE

## 2012-11-19 LAB — LACTIC ACID, PLASMA: Lactic Acid, Venous: 2.2 mmol/L (ref 0.5–2.2)

## 2012-11-19 LAB — CREATININE, SERUM: Creatinine, Ser: 1.02 mg/dL (ref 0.50–1.35)

## 2012-11-19 LAB — GLUCOSE, CAPILLARY: Glucose-Capillary: 159 mg/dL — ABNORMAL HIGH (ref 70–99)

## 2012-11-19 MED ORDER — ONDANSETRON HCL 4 MG PO TABS: 4.0000 mg | ORAL_TABLET | Freq: Four times a day (QID) | ORAL | Status: DC | PRN

## 2012-11-19 MED ORDER — IOHEXOL 350 MG/ML SOLN
100.0000 mL | Freq: Once | INTRAVENOUS | Status: AC | PRN
  Administered 2012-11-19: 100 mL via INTRAVENOUS

## 2012-11-19 MED ORDER — NITROGLYCERIN 0.4 MG SL SUBL
0.4000 mg | SUBLINGUAL_TABLET | SUBLINGUAL | Status: DC | PRN
  Administered 2012-11-19: 0.4 mg via SUBLINGUAL

## 2012-11-19 MED ORDER — ONDANSETRON HCL 4 MG/2ML IJ SOLN: 4.0000 mg | Freq: Four times a day (QID) | INTRAMUSCULAR | Status: DC | PRN

## 2012-11-19 MED ORDER — PANTOPRAZOLE SODIUM 40 MG PO TBEC
40.0000 mg | DELAYED_RELEASE_TABLET | Freq: Every day | ORAL | Status: DC
  Administered 2012-11-19 – 2012-11-22 (×4): 40 mg via ORAL
  Filled 2012-11-19 (×4): qty 1

## 2012-11-19 MED ORDER — SODIUM CHLORIDE 0.9 % IV BOLUS (SEPSIS)
500.0000 mL | Freq: Once | INTRAVENOUS | Status: AC
  Administered 2012-11-19: 500 mL via INTRAVENOUS

## 2012-11-19 MED ORDER — MORPHINE SULFATE 2 MG/ML IJ SOLN: 2.0000 mg | INTRAMUSCULAR | Status: DC | PRN

## 2012-11-19 MED ORDER — PIPERACILLIN-TAZOBACTAM 3.375 G IVPB 30 MIN
3.3750 g | Freq: Once | INTRAVENOUS | Status: AC
  Administered 2012-11-19: 3.375 g via INTRAVENOUS
  Filled 2012-11-19: qty 50

## 2012-11-19 MED ORDER — FELODIPINE ER 5 MG PO TB24
5.0000 mg | ORAL_TABLET | Freq: Two times a day (BID) | ORAL | Status: DC
  Administered 2012-11-19 – 2012-11-22 (×6): 5 mg via ORAL
  Filled 2012-11-19 (×7): qty 1

## 2012-11-19 MED ORDER — INSULIN ASPART 100 UNIT/ML ~~LOC~~ SOLN
0.0000 [IU] | SUBCUTANEOUS | Status: DC
  Administered 2012-11-19: 3 [IU] via SUBCUTANEOUS

## 2012-11-19 MED ORDER — LAMOTRIGINE 25 MG PO TABS
50.0000 mg | ORAL_TABLET | Freq: Two times a day (BID) | ORAL | Status: DC
  Administered 2012-11-19 – 2012-11-22 (×6): 50 mg via ORAL
  Filled 2012-11-19 (×7): qty 2

## 2012-11-19 MED ORDER — INSULIN ASPART 100 UNIT/ML ~~LOC~~ SOLN
0.0000 [IU] | Freq: Three times a day (TID) | SUBCUTANEOUS | Status: DC
  Administered 2012-11-20: 2 [IU] via SUBCUTANEOUS
  Administered 2012-11-20 – 2012-11-21 (×4): 3 [IU] via SUBCUTANEOUS
  Administered 2012-11-21: 18:00:00 2 [IU] via SUBCUTANEOUS
  Administered 2012-11-22: 10:00:00 8 [IU] via SUBCUTANEOUS

## 2012-11-19 MED ORDER — DOCUSATE SODIUM 100 MG PO CAPS
100.0000 mg | ORAL_CAPSULE | Freq: Two times a day (BID) | ORAL | Status: DC
  Administered 2012-11-20 (×2): 100 mg via ORAL
  Filled 2012-11-19 (×8): qty 1

## 2012-11-19 MED ORDER — SODIUM CHLORIDE 0.9 % IV BOLUS (SEPSIS)
1000.0000 mL | Freq: Once | INTRAVENOUS | Status: AC
  Administered 2012-11-19: 1000 mL via INTRAVENOUS

## 2012-11-19 MED ORDER — SODIUM CHLORIDE 0.9 % IV SOLN
INTRAVENOUS | Status: DC
  Administered 2012-11-19: 11:00:00 via INTRAVENOUS

## 2012-11-19 MED ORDER — SIMVASTATIN 40 MG PO TABS
40.0000 mg | ORAL_TABLET | Freq: Every evening | ORAL | Status: DC
  Administered 2012-11-19 – 2012-11-21 (×3): 40 mg via ORAL
  Filled 2012-11-19 (×4): qty 1

## 2012-11-19 MED ORDER — VANCOMYCIN HCL IN DEXTROSE 1-5 GM/200ML-% IV SOLN
1000.0000 mg | Freq: Once | INTRAVENOUS | Status: AC
  Administered 2012-11-19: 1000 mg via INTRAVENOUS
  Filled 2012-11-19: qty 200

## 2012-11-19 MED ORDER — HEPARIN SODIUM (PORCINE) 5000 UNIT/ML IJ SOLN
5000.0000 [IU] | Freq: Three times a day (TID) | INTRAMUSCULAR | Status: DC
  Administered 2012-11-19 – 2012-11-21 (×7): 5000 [IU] via SUBCUTANEOUS
  Filled 2012-11-19 (×12): qty 1

## 2012-11-19 MED ORDER — METOPROLOL TARTRATE 25 MG/10 ML ORAL SUSPENSION
12.5000 mg | Freq: Two times a day (BID) | ORAL | Status: DC
  Administered 2012-11-19: 12.5 mg via ORAL
  Filled 2012-11-19 (×4): qty 5

## 2012-11-19 MED ORDER — DEXTROSE 5 % IV SOLN
1.0000 g | Freq: Three times a day (TID) | INTRAVENOUS | Status: DC
  Administered 2012-11-19 – 2012-11-20 (×3): 1 g via INTRAVENOUS
  Filled 2012-11-19 (×6): qty 1

## 2012-11-19 MED ORDER — VANCOMYCIN HCL IN DEXTROSE 750-5 MG/150ML-% IV SOLN
750.0000 mg | Freq: Two times a day (BID) | INTRAVENOUS | Status: DC
  Administered 2012-11-19 – 2012-11-21 (×4): 750 mg via INTRAVENOUS
  Filled 2012-11-19 (×6): qty 150

## 2012-11-19 MED ORDER — HYDROCODONE-ACETAMINOPHEN 5-325 MG PO TABS
1.0000 | ORAL_TABLET | ORAL | Status: DC | PRN
  Administered 2012-11-19: 2 via ORAL
  Filled 2012-11-19: qty 2

## 2012-11-19 MED ORDER — ASPIRIN 81 MG PO TABS
81.0000 mg | ORAL_TABLET | Freq: Every day | ORAL | Status: DC
  Administered 2012-11-20 – 2012-11-22 (×3): 81 mg via ORAL
  Filled 2012-11-19 (×4): qty 1

## 2012-11-19 MED ORDER — SODIUM CHLORIDE 0.9 % IV SOLN: INTRAVENOUS | Status: AC

## 2012-11-19 MED ORDER — ACETAMINOPHEN 325 MG PO TABS
650.0000 mg | ORAL_TABLET | ORAL | Status: DC | PRN
  Administered 2012-11-19: 650 mg via ORAL

## 2012-11-19 MED ORDER — MORPHINE SULFATE 4 MG/ML IJ SOLN
4.0000 mg | Freq: Once | INTRAMUSCULAR | Status: AC
  Administered 2012-11-19: 4 mg via INTRAVENOUS
  Filled 2012-11-19: qty 1

## 2012-11-19 MED ORDER — SENNA 8.6 MG PO TABS
1.0000 | ORAL_TABLET | Freq: Two times a day (BID) | ORAL | Status: DC
  Administered 2012-11-20: 8.6 mg via ORAL
  Filled 2012-11-19 (×8): qty 1

## 2012-11-19 NOTE — ED Provider Notes (Signed)
CSN: [id number]     Arrival date & time 11/19/12  4098 History   First MD Initiated Contact with Patient 11/19/12 [phone]     Chief Complaint  Patient presents with  . Chest Pain   (Consider location/radiation/quality/duration/timing/severity/associated sxs/prior Treatment) HPI Patient presents with chest pain that awoke him from sleep at 3:30 AM. He states the pain is described as tightness it doesn't radiate. It is worse when he takes a deep breath and coughs. He has very mild shortness of breath associated with it. He has had some coughing productive of sputum. Was seen in emergency department and admitted for similar type of pain earlier this month. He has negative cardiac workup but this did not include provocative testing. CTA of his chest was without evidence of PE. He was treated for bronchitis and sent home. Patient states the symptoms improved dramatically until this morning. He denies lower extremity swelling or pain. Denies fever or chills. Past Medical History  Diagnosis Date  . Diabetes mellitus without complication   . Hypertension    No past surgical history on file. No family history on file. History  Substance Use Topics  . Smoking status: Never Smoker   . Smokeless tobacco: Not on file  . Alcohol Use: Yes    Review of Systems  Constitutional: Negative for fever and chills.  Respiratory: Positive for cough and shortness of breath. Negative for wheezing.   Cardiovascular: Positive for chest pain.  Gastrointestinal: Negative for nausea, vomiting and abdominal pain.  Musculoskeletal: Negative for back pain.  Skin: Negative for rash and wound.  Neurological: Negative for dizziness, light-headedness, numbness and headaches.  All other systems reviewed and are negative.    Allergies  Review of patient's allergies indicates not on file.  Home Medications   Current Outpatient Rx  Name  Route  Sig  Dispense  Refill  . aspirin 81 MG tablet   Oral   Take 81 mg by  mouth daily.         Marland Kitchen azithromycin (ZITHROMAX) 500 MG tablet   Oral   Take 1 tablet (500 mg total) by mouth daily.   3 tablet   0   . esomeprazole (NEXIUM) 40 MG capsule   Oral   Take 40 mg by mouth at bedtime.         . felodipine (PLENDIL) 5 MG 24 hr tablet   Oral   Take 5 mg by mouth 2 (two) times daily.         Marland Kitchen glyBURIDE (DIABETA) 1.25 MG tablet   Oral   Take 1.25 mg by mouth 2 (two) times daily.         Marland Kitchen lamoTRIgine (LAMICTAL) 25 MG tablet   Oral   Take 50 mg by mouth 2 (two) times daily.         . metFORMIN (GLUCOPHAGE) 1000 MG tablet   Oral   Take 1,000 mg by mouth 2 (two) times daily.         . Multiple Vitamins-Minerals (MULTIVITAMIN PO)   Oral   Take 1 tablet by mouth daily.         Marland Kitchen OVER THE COUNTER MEDICATION   Oral   Take 1 tablet by mouth 2 (two) times daily.         . simvastatin (ZOCOR) 40 MG tablet   Oral   Take 40 mg by mouth every evening.         . tadalafil (CIALIS) 20 MG tablet   Oral  Take 20 mg by mouth daily as needed for erectile dysfunction.          BP 122/63  Pulse 123  Temp(Src) 97.8 F (36.6 C) (Oral)  Resp 20  SpO2 93% Physical Exam  Nursing note and vitals reviewed. Constitutional: He is oriented to person, place, and time. He appears well-developed and well-nourished. He appears distressed.  HENT:  Head: Normocephalic and atraumatic.  Mouth/Throat: Oropharynx is clear and moist.  Eyes: EOM are normal. Pupils are equal, round, and reactive to light.  Neck: Normal range of motion. Neck supple.  Cardiovascular: Normal rate and regular rhythm.   Pulmonary/Chest: Effort normal. No respiratory distress. He has no wheezes. He has rales (bl lower lung fields).  Abdominal: Soft. Bowel sounds are normal. He exhibits no distension and no mass. There is no tenderness. There is no rebound and no guarding.  Musculoskeletal: Normal range of motion. He exhibits no edema and no tenderness.  swelling or edema.   Neurological: He is alert and oriented to person, place, and time.  Patient was extremities without deficit. Sensation grossly intact.  Skin: Skin is warm and dry. No rash noted. No erythema.  Psychiatric: He has a normal mood and affect. His behavior is normal.    ED Course  Procedures (including critical care time) Labs Review Labs Reviewed  CBC WITH DIFFERENTIAL  BASIC METABOLIC PANEL  PRO B NATRIURETIC PEPTIDE   Imaging Review No results found.  Date: 11/19/2012  Rate: 116  Rhythm: sinus tachycardia  QRS Axis: normal  Intervals: normal  ST/T Wave abnormalities: nonspecific T wave changes  Conduction Disutrbances:nonspecific intraventricular conduction delay  Narrative Interpretation:   Old EKG Reviewed: changes noted  CRITICAL CARE Performed by: Ranae Palms, Zeek Rostron Total critical care time: 60 min Critical care time was exclusive of separately billable procedures and treating other patients. Critical care was necessary to treat or prevent imminent or life-threatening deterioration. Critical care was time spent personally by me on the following activities: development of treatment plan with patient and/or surrogate as well as nursing, discussions with consultants, evaluation of patient's response to treatment, examination of patient, obtaining history from patient or surrogate, ordering and performing treatments and interventions, ordering and review of laboratory studies, ordering and review of radiographic studies, pulse oximetry and re-evaluation of patient's condition. Care   MDM   Patient's blood pressure briefly dropped after nitroglycerin. Responded to IV fluids. Patient with persistent pain despite nitroglycerin and aspirin  Dr. Janece Canterbury reviewed EKG. With negative troponin and EKG not showing any acute coronary event, he believes ACS is unlikely. Stated he thinks that PE should be ruled out and medicine consulted to admit.  Patient's pain is now improved. His lactic  acid is elevated his and his heart rate remains elevated. His white blood cell count is over 20,000. He has persistent bilateral base infiltrates on both his x-rays and CTs. We'll go ahead and start broad spectrum antibiotics for presumed HCP with sepsis. I discussed with [facility] and they will see the patient in emergency department they request a stat echo for new small pleural effusion.  Loren Racer, MD 11/19/12 [phone]

## 2012-11-19 NOTE — H&P (Signed)
[facility] History and Physical  Jakyrie Totherow JXB:[id number] DOB: 12-Dec-1945 DOA: 11/19/2012  Referring physician: None PCP: Joycelyn Rua, MD  Specialists: None  Chief Complaint: Chest discomfort  HPI: Joshua Randall is a 67 y.o. male had a past medical history of diabetes and hypertension that reports chest pain starting at 3:30 this morning. Patient states that the chest pain runs across the top of his chest. He characterizes as a chest tightness and irritating pain. The pain is noted to be substernal with no radiation. Nothing seems to make the pain better.  The pain is worsened when he lies down and inhale deeply. Patient did take 4 baby aspirin as: His chest pain started. Of note he was recently seen and admitted for similar complaint on 11/08/2012.    Patient currently denies any sick contacts. Patient has complained of some shortness of breath. He does state he also has a cough which is somewhat productive with clear sputum.  Patient states he's never seen a cardiologist.  Although heart disease does run in his family.  In the emergency department patient had a negative troponin EKG was conducted shortness showing sinus tachycardia. Patient was given nitroglycerin however this dropped his blood pressure. Patient was instructed on IV fluids which his blood pressure then became normotensive. CT angiogram of the chest was conducted showing no PE however showing a small pericardial effusion, as well as mild patchy infiltrate in the bases with atelectasis.   Review of Systems: The patient denies anorexia, fever, weight loss,, vision loss, decreased hearing, hoarseness, syncope, dyspnea on exertion, peripheral edema, balance deficits, hemoptysis, abdominal pain, melena, hematochezia, severe indigestion/heartburn, hematuria, incontinence, genital sores, muscle weakness, suspicious skin lesions, transient blindness, difficulty walking, depression, unusual weight change, abnormal bleeding,  enlarged lymph nodes, angioedema, and breast masses.   Past Medical History  Diagnosis Date  . Diabetes mellitus without complication   . Hypertension   . GERD (gastroesophageal reflux disease)   . Seizures   . Hyperlipidemia    Past Surgical History  Procedure Laterality Date  . Cholecystectomy    . Tonsillectomy     Social History:  reports that he has never smoked. He does not have any smokeless tobacco history on file. He reports that he drinks about one beer per day. He reports that he does not use illicit drugs.  Currently lives at home with his wife and is currently working.   No Known Allergies  History reviewed. Father had coronary artery disease and is underwent CABG in his 16s.  Mother had an irregular heartbeat which is thought to be due to scarlet fever. Brother has hypertension.  Prior to Admission medications   Medication Sig Start Date End Date Taking? Authorizing Provider  aspirin 81 MG tablet Take 81 mg by mouth daily.   Yes Historical Provider, MD  esomeprazole (NEXIUM) 40 MG capsule Take 40 mg by mouth at bedtime.   Yes Historical Provider, MD  felodipine (PLENDIL) 5 MG 24 hr tablet Take 5 mg by mouth 2 (two) times daily.   Yes Historical Provider, MD  glyBURIDE (DIABETA) 1.25 MG tablet Take 1.25 mg by mouth 2 (two) times daily.   Yes Historical Provider, MD  lamoTRIgine (LAMICTAL) 25 MG tablet Take 50 mg by mouth 2 (two) times daily.   Yes Historical Provider, MD  metFORMIN (GLUCOPHAGE) 1000 MG tablet Take 1,000 mg by mouth 2 (two) times daily.   Yes Historical Provider, MD  Multiple Vitamins-Minerals (MULTIVITAMIN PO) Take 1 tablet by mouth daily.  Yes Historical Provider, MD  OVER THE COUNTER MEDICATION Take 1 tablet by mouth 2 (two) times daily.   Yes Historical Provider, MD  simvastatin (ZOCOR) 40 MG tablet Take 40 mg by mouth every evening.   Yes Historical Provider, MD  tadalafil (CIALIS) 20 MG tablet Take 20 mg by mouth daily as needed for erectile  dysfunction.   Yes Historical Provider, MD   Physical Exam: Filed Vitals:   11/19/12 1000  BP: 119/83  Pulse: 126  Temp:   Resp: 22     General:  Well-developed well-nourished patient in mild distress.  HEENT: PERRLA normocephalic non-traumatic  Neck: Supple no JVD noted no goiter noted  Cardiovascular: Regular rhythm however tachycardic.+S1/S2  Respiratory: Clear to auscultation bilaterally no wheezes noted  Abdomen: Soft, nondistended, nontender, positive bowel sounds noted, no organomegaly  Skin: Warm and dry no rashes noted  Extremities:  No cyanosis or clubbing or edema. Pulses 2+.  Psychiatric: Appropriate mood and affect.  Neurologic: Alert and oriented x3. Cranial nerves II through XII are intact. No sensory or motor loss.  Labs on Admission:  Basic Metabolic Panel:  Recent Labs Lab 11/19/12 0735  NA 135  K 4.0  CL 96  CO2 24  GLUCOSE 223*  BUN 17  CREATININE 1.38*  CALCIUM 9.9   Liver Function Tests: No results found for this basename: AST, ALT, ALKPHOS, BILITOT, PROT, ALBUMIN,  in the last 168 hours No results found for this basename: LIPASE, AMYLASE,  in the last 168 hours No results found for this basename: AMMONIA,  in the last 168 hours CBC:  Recent Labs Lab 11/19/12 0735  WBC 23.1*  NEUTROABS 21.0*  HGB 14.5  HCT 40.8  MCV 88.5  PLT 545*   Cardiac Enzymes: No results found for this basename: CKTOTAL, CKMB, CKMBINDEX, TROPONINI,  in the last 168 hours  BNP (last 3 results)  Recent Labs  11/08/12 0606  PROBNP 228.7*   CBG: No results found for this basename: GLUCAP,  in the last 168 hours  Radiological Exams on Admission: Ct Angio Chest Pe W/cm &/or Wo Cm  11/19/2012   CLINICAL DATA:  Chest pain  EXAM: CT ANGIOGRAPHY CHEST WITH CONTRAST  TECHNIQUE: Multidetector CT imaging of the chest was performed using the standard protocol during bolus administration of intravenous contrast. Multiplanar CT image reconstructions including  MIPs were obtained to evaluate the vascular anatomy.  CONTRAST:  OMNIPAQUE IOHEXOL 350 MG/ML SOLN  COMPARISON:  Chest radiograph November 19, 2012 and CT angiogram chest November 08, 2012  FINDINGS: There is no demonstrable pulmonary embolus. There is no thoracic aortic aneurysm or dissection.  There is a new fairly small but present pericardial effusion.  There is mild patchy infiltrate in the bases with atelectasis. Elsewhere, lungs are clear.  There is no appreciable thoracic adenopathy. There are multiple foci of coronary artery calcification.  Visualized upper abdominal structures appear unremarkable. There is degenerative change in the thoracic spine. There are no blastic or lytic bone lesions. Thyroid appears normal.  Review of the MIP images confirms the above findings.  IMPRESSION: No demonstrable pulmonary embolus.  Pericardial effusion present.  Patchy consolidation in the lung bases, primarily due to atelectasis.  No adenopathy.  Multiple foci of coronary artery calcification.   Electronically Signed   By: Bretta Bang   On: 11/19/2012 08:35   Dg Chest Portable 1 View  11/19/2012   CLINICAL DATA:  Chest pain. Shortness of breath.  EXAM: PORTABLE CHEST - 1 VIEW  COMPARISON:  11/08/2012  FINDINGS: Low lung volumes noted with linear and bandlike opacities in the lower lobes. Mildly enlarged cardiopericardial silhouette. No edema.  IMPRESSION: 1. Bandlike opacities in the lung bases, favoring atelectasis based on morphology, although pneumonia is not excluded. 2. Cardiomegaly, without edema.   Electronically Signed   By: Herbie Baltimore   On: 11/19/2012 07:16    EKG: Independently reviewed. Sinus tachycardia with a rate of 122 possible right bundle branch block.  Assessment/Plan 1.  Sepsis   -Secondary to possible healthcare acquired pneumonia. Patient was recently admitted to the hospital on September 15.  Patient has a lactic acidosis 4.51. Patient is also tachycardic as well as  leukocytosis.  At this time we'll obtain blood cultures as well as sputum culture and Gram stain. Cover him empirically with vancomycin and cefepime with pharmacy to dose and follow. We'll continue gentle D. at rehydration with normal saline.  Patient will be admitted to the step down unit for closer monitoring.   2. Chest discomfort  Currently chest pain has not subsided. We'll continue to monitor patient's troponins x3 sets, will also obtain magnesium and phosphate level.. Patient will be a step down unit with telemetry monitoring. Will be giving him morphine for severe discomfort. We'll also add on metoprolol as well as aspirin. Will obtain an EKG in the morning. If needed may consults cardiology.  Of note TSH was conducted on on 11/09/2012 and a son to be 1.4 with a free T4 of 1.23  3. Pericardial Effusion  -Pericardial effusion was noted to be on CT scan of the chest. Will obtain an echocardiogram.    4. Hypertension  -Currently normotensive we'll continue IV fluids as well as restart him on his home medication, felodipine.    5. Diabetes  We will patient's metformin as well as glyburide. We'll place him on insulin sliding scale.  6. Thrombocytosis - may be acute phase reactants. We'll continue to monitor with daily CBC.  7. Acute kidney failure- creatinine is 1.38, admission patient had a normal creatinine 0.7. Will continue hydrating patient and recheck his BMP in the morning.  Code Status: Full Family Communication: Wife at bedside. Disposition Plan: Admit to inpatient, step down.  Will continue to monitor.  Time spent: 60 minutes  Catha Gosselin [facility] Pager [phone]  If 7PM-7AM, please contact night-coverage [email or web address] Password [email or web address] 11/19/2012, 10:12 AM

## 2012-11-19 NOTE — ED Notes (Signed)
Patient here with recurrence of chest pain. Recently admitted for the same, treated for bronchitis and released home. Pain returned suddenly this AM.

## 2012-11-19 NOTE — ED Notes (Signed)
Patient took 4 baby aspirin prior to coming to hospital this AM.

## 2012-11-19 NOTE — Progress Notes (Signed)
Utilization review completed. Taren Toops, RN, BSN. 

## 2012-11-19 NOTE — Progress Notes (Addendum)
ANTIBIOTIC CONSULT NOTE - INITIAL  Pharmacy Consult for Vancomycin, Cefepime x 8 days Indication: pneumonia/sepsis  No Known Allergies  Patient Measurements: Height: 5\' 9"  (175.3 cm) Weight: 186 lb 4.6 oz (84.5 kg) IBW/kg (Calculated) : 70.7  Labs:  Recent Labs  11/19/12 0735  WBC 23.1*  HGB 14.5  PLT 545*  CREATININE 1.38*   Estimated Creatinine Clearance: 53.4 ml/min (by C-G formula based on Cr of 1.38). No results found for this basename: VANCOTROUGH, Leodis Binet, VANCORANDOM, GENTTROUGH, GENTPEAK, GENTRANDOM, TOBRATROUGH, TOBRAPEAK, TOBRARND, AMIKACINPEAK, AMIKACINTROU, AMIKACIN,  in the last 72 hours   Microbiology: Recent Results (from the past 720 hour(s))  CULTURE, BLOOD (ROUTINE X 2)     Status: None   Collection Time    11/08/12  7:30 AM      Result Value Range Status   Specimen Description BLOOD LEFT ANTECUBITAL   Final   Special Requests BOTTLES DRAWN AEROBIC ONLY   Final   Culture  Setup Time     Final   Value: 11/08/2012 14:49     Performed at [facility]   Culture     Final   Value: NO GROWTH 5 DAYS     Performed at [facility]   Report Status 11/14/2012 FINAL   Final  CULTURE, BLOOD (ROUTINE X 2)     Status: None   Collection Time    11/08/12  7:40 AM      Result Value Range Status   Specimen Description BLOOD LEFT ANTECUBITAL   Final   Special Requests     Final   Value: BOTTLES DRAWN AEROBIC AND ANAEROBIC BLUE RED   Culture  Setup Time     Final   Value: 11/08/2012 14:49     Performed at [facility]   Culture     Final   Value: NO GROWTH 5 DAYS     Performed at [facility]   Report Status 11/14/2012 FINAL   Final  MRSA PCR SCREENING     Status: None   Collection Time    11/08/12  1:39 PM      Result Value Range Status   MRSA by PCR NEGATIVE  NEGATIVE Final   Comment:            The GeneXpert MRSA Assay (FDA     approved for NASAL specimens     only), is one component of a     comprehensive  MRSA colonization     surveillance program. It is not     intended to diagnose MRSA     infection nor to guide or     monitor treatment for     MRSA infections.  URINE CULTURE     Status: None   Collection Time    11/09/12 12:01 AM      Result Value Range Status   Specimen Description URINE, CLEAN CATCH   Final   Special Requests NONE   Final   Culture  Setup Time     Final   Value: 11/09/2012 02:05     Performed at [facility] Count     Final   Value: NO GROWTH     Performed at [facility]   Culture     Final   Value: NO GROWTH     Performed at [facility]   Report Status 11/10/2012 FINAL   Final    Medical History: Past Medical History  Diagnosis Date  .  Diabetes mellitus without complication   . Hypertension   . GERD (gastroesophageal reflux disease)   . Seizures   . Hyperlipidemia     Assessment: 67 year old male beginning broad spectrum antibiotics for sepsis secondary to possible HCAP.  He was recently admitted to the hospital on September 15.  Beginning Cefepime and Vancomycin  Goal of Therapy:  Vancomycin trough level 15-20 mcg/ml Appropriate Cefepime dosing  Plan:  1) Cefepime 1 Gram iv Q 8 hours 2) Vancomycin 750 mg iv Q 12 hours beginning at 8:30 pm 3) Follow up cultures, Scr, fever curve, progress  Thank you. Okey Regal, PharmD [phone]  11/19/2012,10:49 AM

## 2012-11-20 DIAGNOSIS — A419 Sepsis, unspecified organism: Secondary | ICD-10-CM

## 2012-11-20 DIAGNOSIS — I319 Disease of pericardium, unspecified: Secondary | ICD-10-CM

## 2012-11-20 LAB — INFLUENZA PANEL BY PCR (TYPE A & B)
H1N1 flu by pcr: NOT DETECTED
Influenza B By PCR: NEGATIVE

## 2012-11-20 LAB — COMPREHENSIVE METABOLIC PANEL
ALT: 38 U/L (ref 0–53)
AST: 26 U/L (ref 0–37)
Albumin: 3.6 g/dL (ref 3.5–5.2)
Alkaline Phosphatase: 91 U/L (ref 39–117)
Calcium: 9.7 mg/dL (ref 8.4–10.5)
Chloride: 101 mEq/L (ref 96–112)
GFR calc Af Amer: >90 mL/min (ref >90)
Potassium: 4.1 mEq/L (ref 3.5–5.1)
Sodium: 137 mEq/L (ref 135–145)
Total Protein: 7.6 g/dL (ref 6.0–8.3)

## 2012-11-20 LAB — TROPONIN I
Troponin I: <0.3 ng/mL (ref <0.30)
Troponin I: <0.3 ng/mL (ref <0.30)

## 2012-11-20 LAB — SEDIMENTATION RATE: Sed Rate: 65 mm/hr — ABNORMAL HIGH (ref 0–16)

## 2012-11-20 LAB — GLUCOSE, CAPILLARY
Glucose-Capillary: 183 mg/dL — ABNORMAL HIGH (ref 70–99)
Glucose-Capillary: 151 mg/dL — ABNORMAL HIGH (ref 70–99)
Glucose-Capillary: 146 mg/dL — ABNORMAL HIGH (ref 70–99)
Glucose-Capillary: 172 mg/dL — ABNORMAL HIGH (ref 70–99)

## 2012-11-20 LAB — CBC
MCH: 32.1 pg (ref 26.0–34.0)
MCHC: 36.4 g/dL — ABNORMAL HIGH (ref 30.0–36.0)
Platelets: 449 10*3/uL — ABNORMAL HIGH (ref 150–400)
RDW: 13.3 % (ref 11.5–15.5)
WBC: 15.6 10*3/uL — ABNORMAL HIGH (ref 4.0–10.5)

## 2012-11-20 LAB — RHEUMATOID FACTOR: Rhuematoid fact SerPl-aCnc: 17 IU/mL — ABNORMAL HIGH (ref <=14)

## 2012-11-20 MED ORDER — METOPROLOL TARTRATE 12.5 MG HALF TABLET
12.5000 mg | ORAL_TABLET | Freq: Two times a day (BID) | ORAL | Status: DC
  Administered 2012-11-20 – 2012-11-22 (×5): 12.5 mg via ORAL
  Filled 2012-11-20 (×6): qty 1

## 2012-11-20 MED ORDER — IBUPROFEN 600 MG PO TABS
600.0000 mg | ORAL_TABLET | Freq: Three times a day (TID) | ORAL | Status: DC
  Administered 2012-11-20 – 2012-11-22 (×6): 600 mg via ORAL
  Filled 2012-11-20 (×9): qty 1

## 2012-11-20 NOTE — Progress Notes (Signed)
Patient ID: Joshua Randall, male   DOB: [birth year], 67 y.o.   MRN: [id number] [facility] PROGRESS NOTE  Brylin Stanislawski IHK:[id number] DOB: 06-Oct-1945 DOA: 11/19/2012 PCP: Joycelyn Rua, MD  Assessment/Plan:   Chest discomfort: Chest pain is mild this morning.  Seems pleuritic in nature, worse with deep inspiration and with laying down.  CTA negative for PE.  There are mild patchy infiltrates at both bases which could suggest infection.  There is a small pleural effusion, ECHO is planned for today.  He has recently been admitted and treated for CAP.  He completed therapy and returned to normal function, and within the past two weeks has had what he describes as recurrence of similar symptoms.  Cardiac enzymes have been negative, EKG's and telemetry have shown only sinus tachycardia.  Suspect this could be pericarditis.  Will start NSAIDS and start rheum work up.   Hypertension: controled.   Leukocytosis: improving.    Tachycardia: likely due to pericarditis. Continue metoprolol. Continue telemetry.   Diabetes: controled. Continue ssi,    HCAP (healthcare-associated pneumonia): with recent admission, leucocytosis, possible infiltrate on CT, chest pain will need to treat for HCAP.  Continue with broad spectrum antibiotics.  Blood cultures pending.   Acute renal failure: resolved with hydration. Thrombocytosis: stable. Could be an acute reaction.  Code Status: FULL Family Communication: spoke with patient who is alert and oriented Disposition Plan: transfer to telemetry today   Consultants:  cardiology  Procedures:  ECHO  Antibiotics:  vancomycin started 9/26 for HCAP  Zosyn started 9/26 for HCAP  HPI/Subjective: 67 year old male with pmh of HTN and DM admitted on 9/26 for chest pain.  He was admitted from 9/15-9/17 for chest pain which he states was similar to the current complaint.  He ruled out for ACS and was treated for CAP.  After admission he returned to normal function, went  back to work and had recurrence of symptoms 9 days later.  So far CE's have been negative and EKG only sinus tach.  He is in no pain this morning other than when he takes a very deep breath.  Pain is substernal and does not radiate.  Objective: Filed Vitals:   11/20/12 0800  BP: 147/88  Pulse: 120  Temp: 99 F (37.2 C)  Resp: 20    Intake/Output Summary (Last 24 hours) at 11/20/12 0929 Last data filed at 11/20/12 0825  Gross per 24 hour  Intake    610 ml  Output    900 ml  Net   -290 ml   Filed Weights   11/19/12 1039  Weight: 84.5 kg (186 lb 4.6 oz)    Exam:   General:  No distress.  Alert, cooperative.  Cardiovascular: tachycardic, regular, no murmur, mild S3, no rub, no edema, no JVD, peripheral pulses 2+  Respiratory: no distress, good air movement, mild bibasilar crackles  Abdomen: soft, nontender, non distended, bs normal  Neuro: non focal, AOx3  Data Reviewed: Basic Metabolic Panel:  Recent Labs Lab 11/19/12 0735 11/19/12 1359 11/20/12 0530  NA 135  --  137  K 4.0  --  4.1  CL 96  --  101  CO2 24  --  25  GLUCOSE 223*  --  179*  BUN 17  --  10  CREATININE 1.38* 1.02 0.81  CALCIUM 9.9  --  9.7  MG  --  1.7  --   PHOS  --  3.7  --    Liver Function Tests:  Recent  Labs Lab 11/20/12 0530  AST 26  ALT 38  ALKPHOS 91  BILITOT 0.6  PROT 7.6  ALBUMIN 3.6   No results found for this basename: LIPASE, AMYLASE,  in the last 168 hours No results found for this basename: AMMONIA,  in the last 168 hours CBC:  Recent Labs Lab 11/19/12 0735 11/19/12 1359 11/20/12 0530  WBC 23.1* 22.1* 15.6*  NEUTROABS 21.0*  --   --   HGB 14.5 13.0 14.2  HCT 40.8 35.4* 39.0  MCV 88.5 88.1 88.2  PLT 545* 449* 449*   Cardiac Enzymes: No results found for this basename: CKTOTAL, CKMB, CKMBINDEX, TROPONINI,  in the last 168 hours BNP (last 3 results)  Recent Labs  11/08/12 0606 11/19/12 1359  PROBNP 228.7* 108.8   CBG:  Recent Labs Lab 11/19/12 1322  11/19/12 1712 11/19/12 2121  GLUCAP 173* 120* 159*    No results found for this or any previous visit (from the past 240 hour(s)).   Studies: Ct Angio Chest Pe W/cm &/or Wo Cm  11/19/2012   CLINICAL DATA:  Chest pain  EXAM: CT ANGIOGRAPHY CHEST WITH CONTRAST  TECHNIQUE: Multidetector CT imaging of the chest was performed using the standard protocol during bolus administration of intravenous contrast. Multiplanar CT image reconstructions including MIPs were obtained to evaluate the vascular anatomy.  CONTRAST:  OMNIPAQUE IOHEXOL 350 MG/ML SOLN  COMPARISON:  Chest radiograph November 19, 2012 and CT angiogram chest November 08, 2012  FINDINGS: There is no demonstrable pulmonary embolus. There is no thoracic aortic aneurysm or dissection.  There is a new fairly small but present pericardial effusion.  There is mild patchy infiltrate in the bases with atelectasis. Elsewhere, lungs are clear.  There is no appreciable thoracic adenopathy. There are multiple foci of coronary artery calcification.  Visualized upper abdominal structures appear unremarkable. There is degenerative change in the thoracic spine. There are no blastic or lytic bone lesions. Thyroid appears normal.  Review of the MIP images confirms the above findings.  IMPRESSION: No demonstrable pulmonary embolus.  Pericardial effusion present.  Patchy consolidation in the lung bases, primarily due to atelectasis.  No adenopathy.  Multiple foci of coronary artery calcification.   Electronically Signed   By: Bretta Bang   On: 11/19/2012 08:35   Dg Chest Portable 1 View  11/19/2012   CLINICAL DATA:  Chest pain. Shortness of breath.  EXAM: PORTABLE CHEST - 1 VIEW  COMPARISON:  11/08/2012  FINDINGS: Low lung volumes noted with linear and bandlike opacities in the lower lobes. Mildly enlarged cardiopericardial silhouette. No edema.  IMPRESSION: 1. Bandlike opacities in the lung bases, favoring atelectasis based on morphology, although  pneumonia is not excluded. 2. Cardiomegaly, without edema.   Electronically Signed   By: Herbie Baltimore   On: 11/19/2012 07:16    Scheduled Meds: . aspirin  81 mg Oral Daily  . ceFEPime (MAXIPIME) IV  1 g Intravenous Q8H  . docusate sodium  100 mg Oral BID  . felodipine  5 mg Oral BID  . heparin  5,000 Units Subcutaneous Q8H  . insulin aspart  0-15 Units Subcutaneous TID WC  . lamoTRIgine  50 mg Oral BID  . metoprolol tartrate  12.5 mg Oral BID  . pantoprazole  40 mg Oral Daily  . senna  1 tablet Oral BID  . simvastatin  40 mg Oral QPM  . vancomycin  750 mg Intravenous Q12H   Continuous Infusions: . sodium chloride Stopped (11/20/12 2956)  Principal Problem:   Sepsis Active Problems:   Chest discomfort   Hypertension   Leukocytosis   Tachycardia   Lactic acidosis   Diabetes   HCAP (healthcare-associated pneumonia)   Acute renal failure    Time spent: 45 min   [facility]  [facility] Pager [phone]. If 7PM-7AM, please contact night-coverage at [email or web address], password [facility] 11/20/2012, 9:29 AM  LOS: 1 day

## 2012-11-20 NOTE — Progress Notes (Signed)
Transfer to telemetry. See progress note.

## 2012-11-20 NOTE — Progress Notes (Signed)
  Echocardiogram 2D Echocardiogram has been performed.  [place] 11/20/2012, 12:51 PM

## 2012-11-21 LAB — BASIC METABOLIC PANEL
BUN: 12 mg/dL (ref 6–23)
CO2: 24 mEq/L (ref 19–32)
Calcium: 9.2 mg/dL (ref 8.4–10.5)
Chloride: 102 mEq/L (ref 96–112)
Glucose, Bld: 192 mg/dL — ABNORMAL HIGH (ref 70–99)
Potassium: 3.8 mEq/L (ref 3.5–5.1)
Sodium: 138 mEq/L (ref 135–145)

## 2012-11-21 LAB — TROPONIN I: Troponin I: <0.3 ng/mL (ref <0.30)

## 2012-11-21 LAB — CBC
Hemoglobin: 13.4 g/dL (ref 13.0–17.0)
MCH: 32 pg (ref 26.0–34.0)
Platelets: 420 10*3/uL — ABNORMAL HIGH (ref 150–400)
RBC: 4.19 MIL/uL — ABNORMAL LOW (ref 4.22–5.81)
WBC: 12.9 10*3/uL — ABNORMAL HIGH (ref 4.0–10.5)

## 2012-11-21 LAB — LEGIONELLA ANTIGEN, URINE: Legionella Antigen, Urine: NEGATIVE

## 2012-11-21 LAB — GLUCOSE, CAPILLARY
Glucose-Capillary: 173 mg/dL — ABNORMAL HIGH (ref 70–99)
Glucose-Capillary: 161 mg/dL — ABNORMAL HIGH (ref 70–99)
Glucose-Capillary: 133 mg/dL — ABNORMAL HIGH (ref 70–99)
Glucose-Capillary: 179 mg/dL — ABNORMAL HIGH (ref 70–99)

## 2012-11-21 MED ORDER — LEVOFLOXACIN 750 MG PO TABS
750.0000 mg | ORAL_TABLET | Freq: Every day | ORAL | Status: DC
  Administered 2012-11-21 – 2012-11-22 (×2): 750 mg via ORAL
  Filled 2012-11-21 (×3): qty 1

## 2012-11-21 MED ORDER — INFLUENZA VAC SPLIT QUAD 0.5 ML IM SUSP
0.5000 mL | INTRAMUSCULAR | Status: DC
  Filled 2012-11-21: qty 0.5

## 2012-11-21 NOTE — Progress Notes (Signed)
Patient ID: Joshua Randall, male   DOB: 1945/05/04, 67 y.o.   MRN: [id number] [facility] PROGRESS NOTE  Joshua Randall WUJ:[id number] DOB: 17-Aug-1945 DOA: 11/19/2012 PCP: Joycelyn Rua, MD  Assessment/Plan:  1. Chest discomfort: Chest pain almost completely gone. Seems pleuritic in nature, worse with deep inspiration and with laying down. CTA negative for PE. There are mild patchy infiltrates at both bases which could suggest infection. There is a small pleural effusion on CT, ECHO shows mild/moderate size free flowing effusion with no tampnade.  Cardiac enzymes have been negative, EKG's and telemetry have shown only sinus tachycardia. Suspect this is pericarditis either reactive to pneumonia or primary.  Seems to be improved with NSAIDS. RF very slightly elevated. ANA pending. Sed rate increased (non specific) 2. Hypertension: controled. 3. Leukocytosis: improving.  4. Tachycardia: improving. likely due to pericarditis. Continue metoprolol. Continue telemetry. 5. Diabetes: controled. Continue ssi,  6. HCAP (healthcare-associated pneumonia): transition to oral Levaquin today in anticipation of discharge. 7. Acute renal failure: resolved with hydration. 8. Thrombocytosis: stable. Could be an acute reaction. 9. Dispo: monitor HR overnight. If remains stable and below 100 then will dc in am.   Code Status: FULL  Family Communication: spoke with patient who is alert and oriented  Disposition Plan: transfer to telemetry today   Consultants:  Cardiology  Procedures:  ECHO  Antibiotics:  vancomycin started 9/26 for HCAP >>9/28 Zosyn started 9/26 for HCAP>>9/28 levaquin started HCAP 9/28  HPI/Subjective:  67 year old male with pmh of HTN and DM admitted on 9/26 for chest pain. He was admitted from 9/15-9/17 for chest pain which he states was similar to the current complaint. He ruled out for ACS and was treated for CAP. After admission he returned to normal function, went back to work and  had recurrence of symptoms 9 days later. He has again ruled out for ACS.  Today he reports that he feels very well, has been up walking, chest pain is minimal.  Objective: Filed Vitals:   11/21/12 0522  BP: 125/79  Pulse: 94  Temp: 98.8 F (37.1 C)  Resp: 18    Intake/Output Summary (Last 24 hours) at 11/21/12 1404 Last data filed at 11/21/12 0523  Gross per 24 hour  Intake    360 ml  Output      0 ml  Net    360 ml   Filed Weights   11/19/12 1039 11/20/12 1428  Weight: 84.5 kg (186 lb 4.6 oz) 83.326 kg (183 lb 11.2 oz)    Exam: General: No distress. Alert, cooperative.  Cardiovascular: tachycardic, regular, no murmur, mild S3, no rub, no edema, no JVD, peripheral pulses 2+  Respiratory: no distress, good air movement, mild bibasilar crackles  Abdomen: soft, nontender, non distended, bs normal  Neuro: non focal, AOx3  Data Reviewed: Basic Metabolic Panel:  Recent Labs Lab 11/19/12 0735 11/19/12 1359 11/20/12 0530 11/21/12 0209  NA 135  --  137 138  K 4.0  --  4.1 3.8  CL 96  --  101 102  CO2 24  --  25 24  GLUCOSE 223*  --  179* 192*  BUN 17  --  10 12  CREATININE 1.38* 1.02 0.81 0.85  CALCIUM 9.9  --  9.7 9.2  MG  --  1.7  --   --   PHOS  --  3.7  --   --    Liver Function Tests:  Recent Labs Lab 11/20/12 0530  AST 26  ALT 38  ALKPHOS 91  BILITOT 0.6  PROT 7.6  ALBUMIN 3.6   No results found for this basename: LIPASE, AMYLASE,  in the last 168 hours No results found for this basename: AMMONIA,  in the last 168 hours CBC:  Recent Labs Lab 11/19/12 0735 11/19/12 1359 11/20/12 0530 11/21/12 0209  WBC 23.1* 22.1* 15.6* 12.9*  NEUTROABS 21.0*  --   --   --   HGB 14.5 13.0 14.2 13.4  HCT 40.8 35.4* 39.0 37.0*  MCV 88.5 88.1 88.2 88.3  PLT 545* 449* 449* 420*   Cardiac Enzymes:  Recent Labs Lab 11/20/12 1344 11/20/12 1920 11/21/12 0209  TROPONINI <0.30 <0.30 <0.30   BNP (last 3 results)  Recent Labs  11/08/12 0606 11/19/12 1359   PROBNP 228.7* 108.8   CBG:  Recent Labs Lab 11/20/12 1146 11/20/12 1807 11/20/12 2033 11/21/12 0746 11/21/12 1157  GLUCAP 151* 146* 172* 173* 161*    Recent Results (from the past 240 hour(s))  CULTURE, BLOOD (ROUTINE X 2)     Status: None   Collection Time    11/19/12  9:10 AM      Result Value Range Status   Specimen Description BLOOD LEFT HAND   Final   Special Requests BOTTLES DRAWN AEROBIC ONLY 10CC   Final   Culture  Setup Time     Final   Value: 11/19/2012 13:12     Performed at [facility]   Culture     Final   Value:        BLOOD CULTURE RECEIVED NO GROWTH TO DATE CULTURE WILL BE HELD FOR 5 DAYS BEFORE ISSUING A FINAL NEGATIVE REPORT     Performed at [facility]   Report Status PENDING   Incomplete  CULTURE, BLOOD (ROUTINE X 2)     Status: None   Collection Time    11/19/12  9:23 AM      Result Value Range Status   Specimen Description BLOOD RIGHT ANTECUBITAL   Final   Special Requests BOTTLES DRAWN AEROBIC ONLY 10CC   Final   Culture  Setup Time     Final   Value: 11/19/2012 13:12     Performed at [facility]   Culture     Final   Value:        BLOOD CULTURE RECEIVED NO GROWTH TO DATE CULTURE WILL BE HELD FOR 5 DAYS BEFORE ISSUING A FINAL NEGATIVE REPORT     Performed at [facility]   Report Status PENDING   Incomplete  CULTURE, BLOOD (ROUTINE X 2)     Status: None   Collection Time    11/19/12  1:48 PM      Result Value Range Status   Specimen Description BLOOD RIGHT HAND   Final   Special Requests BOTTLES DRAWN AEROBIC ONLY 3CC   Final   Culture  Setup Time     Final   Value: 11/19/2012 20:54     Performed at [facility]   Culture     Final   Value:        BLOOD CULTURE RECEIVED NO GROWTH TO DATE CULTURE WILL BE HELD FOR 5 DAYS BEFORE ISSUING A FINAL NEGATIVE REPORT     Performed at [facility]   Report Status PENDING   Incomplete  CULTURE, BLOOD (ROUTINE X 2)     Status: None   Collection Time     11/19/12  1:57 PM      Result Value  Range Status   Specimen Description BLOOD RIGHT FOREARM   Final   Special Requests BOTTLES DRAWN AEROBIC ONLY 5CC   Final   Culture  Setup Time     Final   Value: 11/19/2012 20:55     Performed at [facility]   Culture     Final   Value:        BLOOD CULTURE RECEIVED NO GROWTH TO DATE CULTURE WILL BE HELD FOR 5 DAYS BEFORE ISSUING A FINAL NEGATIVE REPORT     Performed at [facility]   Report Status PENDING   Incomplete     Studies: No results found.  Scheduled Meds: . aspirin  81 mg Oral Daily  . docusate sodium  100 mg Oral BID  . felodipine  5 mg Oral BID  . heparin  5,000 Units Subcutaneous Q8H  . ibuprofen  600 mg Oral TID  . insulin aspart  0-15 Units Subcutaneous TID WC  . lamoTRIgine  50 mg Oral BID  . metoprolol tartrate  12.5 mg Oral BID  . pantoprazole  40 mg Oral Daily  . senna  1 tablet Oral BID  . simvastatin  40 mg Oral QPM  . vancomycin  750 mg Intravenous Q12H   Continuous Infusions:   Active Problems:   Chest discomfort   Hypertension   Leukocytosis   Tachycardia   Diabetes   HCAP (healthcare-associated pneumonia)   Acute renal failure    Time spent: 35 min   [facility]  [facility] Pager [phone]. If 7PM-7AM, please contact night-coverage at [email or web address], password [facility] 11/21/2012, 2:04 PM  LOS: 2 days

## 2012-11-22 DIAGNOSIS — I309 Acute pericarditis, unspecified: Principal | ICD-10-CM

## 2012-11-22 DIAGNOSIS — D473 Essential (hemorrhagic) thrombocythemia: Secondary | ICD-10-CM

## 2012-11-22 LAB — CBC
HCT: 39 % (ref 39.0–52.0)
MCV: 87.2 fL (ref 78.0–100.0)
Platelets: 478 10*3/uL — ABNORMAL HIGH (ref 150–400)
RBC: 4.47 MIL/uL (ref 4.22–5.81)
RDW: 12.9 % (ref 11.5–15.5)
WBC: 11.6 10*3/uL — ABNORMAL HIGH (ref 4.0–10.5)

## 2012-11-22 LAB — BASIC METABOLIC PANEL
CO2: 23 mEq/L (ref 19–32)
Calcium: 9.7 mg/dL (ref 8.4–10.5)
Chloride: 98 mEq/L (ref 96–112)
Creatinine, Ser: 0.86 mg/dL (ref 0.50–1.35)
GFR calc Af Amer: >90 mL/min (ref >90)
Potassium: 4.1 mEq/L (ref 3.5–5.1)
Sodium: 137 mEq/L (ref 135–145)

## 2012-11-22 LAB — GLUCOSE, CAPILLARY

## 2012-11-22 MED ORDER — IBUPROFEN 600 MG PO TABS: 600.0000 mg | ORAL_TABLET | Freq: Three times a day (TID) | ORAL | Status: DC

## 2012-11-22 MED ORDER — LEVOFLOXACIN 750 MG PO TABS: 750.0000 mg | ORAL_TABLET | Freq: Every day | ORAL | Status: DC

## 2012-11-22 NOTE — Progress Notes (Signed)
Discharge instructions reviewed with patient. Verified pharmacy that prescriptions are to be picked up from. PIV removed. Telemetry box #3 removed and returned. Pt to be discharged to home with spouse.  Joshua Randall, [name]

## 2012-11-22 NOTE — Discharge Summary (Signed)
Physician Discharge Summary  [facility] WGN:[id number] DOB: [birth year] DOA: 11/19/2012  PCP: Joycelyn Rua, MD  Admit date: 11/19/2012 Discharge date: 11/22/2012  Time spent: 45 minutes  Recommendations for Outpatient Follow-up:  Follow up with PCP in 2 weeks, at that time will need to reassess chest pain and activity tolerance.  ANA is pending and needs to be followed.    Discharge Diagnoses:  Principal Problem:   Acute pericarditis, unspecified Active Problems:   Chest discomfort   Hypertension   Leukocytosis   Tachycardia   Diabetes   HCAP (healthcare-associated pneumonia)   Acute renal failure   Thrombocytosis   Discharge Condition: good  Diet recommendation: carb modified heart health  Filed Weights   11/19/12 1039 11/20/12 1428  Weight: 84.5 kg (186 lb 4.6 oz) 83.326 kg (183 lb 11.2 oz)    History of present illness:  This 67 year old man with PMH of DM and HTN presented in 9/26 with chest pain across the top of the chest made worse by deep inspiration and laying down.  He had been admitted from 9/15-9/17 for a similar complaint.  During the prior admission he ruled out for ACS and was treaded for community acquired pneumonia.  He returned to normal function after that admission and was pain free for several days.  Hospital Course:  1) pericarditis:  Cardiac enzymes and EKG showed no sign of ACS.  CT of the chest showed a small pericardial effusion.  ECHO show small to moderate free flowing effusion with no tamponade.  ESR is elevated at 65. RF slightly positive at 17. ANA is pending.  Pericarditis may be reactive to prior pneumonia or may be an independent process.  He has responded very well to NSAIDS and is being discharged on Ibuprofen TID for the next 10 days.   2) Tachycardia: likely due to pericarditis.  Will need to continue to monitor this, he does have a BP cuff at home and will check to be sure resting HR not over 100.  TSH normal at last admission.  3)  HCAP: CT of the chest did show small bibasilar infiltrates which could represent continued or new infection.  As he has just been inpatient he is being treated for HCAP.  Will need a total of 10 days of antibiotics. Continue Levaquin until 10/6. 4) leucocytosis: decreasing likely due to #1 and #3 5) DM: no change to home regimen 6) HTN: no change to home regimen 7) HLD: no change to home regimen 8) GERD: no change to home regimen 9) thrombocyosis: acute phase reaction. Continue to monitor 10) acute renal injury: resolved  Procedures: 9/27 2D ECHO: EF 55%, small to mod pericardial effusion, no tamponade  Consultations:  none  Discharge Exam: Filed Vitals:   11/22/12 0519  BP: 136/81  Pulse: 108  Temp: 98.4 F (36.9 C)  Resp: 18   EXAM   General: No distress. Alert, cooperative.  Cardiovascular: tachycardic, regular, no murmur, mild S3, no rub, no edema, no JVD, peripheral pulses 2+  Respiratory: no distress, good air movement, mild bibasilar crackles  Abdomen: soft, nontender, non distended, bs normal  Neuro: non focal, AOx3 **  Discharge Instructions  Discharge Orders   Future Appointments Provider Department Dept Phone   03/28/2013 3:00 PM Huston Foley, MD [facility] [phone]   Future Orders Complete By Expires   Diet - low sodium heart healthy  As directed    Increase activity slowly  As directed  Medication List         aspirin 81 MG tablet  Take 81 mg by mouth daily.     esomeprazole 40 MG capsule  Commonly known as:  NEXIUM  Take 40 mg by mouth at bedtime.     felodipine 5 MG 24 hr tablet  Commonly known as:  PLENDIL  Take 5 mg by mouth 2 (two) times daily.     glyBURIDE 1.25 MG tablet  Commonly known as:  DIABETA  Take 1.25 mg by mouth 2 (two) times daily.     ibuprofen 600 MG tablet  Commonly known as:  ADVIL,MOTRIN  Take 1 tablet (600 mg total) by mouth 3 (three) times daily.     lamoTRIgine 25 MG tablet  Commonly  known as:  LAMICTAL  Take 50 mg by mouth 2 (two) times daily.     levofloxacin 750 MG tablet  Commonly known as:  LEVAQUIN  Take 1 tablet (750 mg total) by mouth daily.     metFORMIN 1000 MG tablet  Commonly known as:  GLUCOPHAGE  Take 1,000 mg by mouth 2 (two) times daily.     MULTIVITAMIN PO  Take 1 tablet by mouth daily.     OVER THE COUNTER MEDICATION  Take 1 tablet by mouth 2 (two) times daily.     simvastatin 40 MG tablet  Commonly known as:  ZOCOR  Take 40 mg by mouth every evening.     tadalafil 20 MG tablet  Commonly known as:  CIALIS  Take 20 mg by mouth daily as needed for erectile dysfunction.       No Known Allergies     Follow-up Information   Follow up with Joycelyn Rua, MD In 2 weeks.   Specialty:  Family Medicine   Contact information:   [address] [place] [address] [phone]        The results of significant diagnostics from this hospitalization (including imaging, microbiology, ancillary and laboratory) are listed below for reference.    Significant Diagnostic Studies: Ct Angio Chest Pe W/cm &/or Wo Cm  11/19/2012   CLINICAL DATA:  Chest pain  EXAM: CT ANGIOGRAPHY CHEST WITH CONTRAST  TECHNIQUE: Multidetector CT imaging of the chest was performed using the standard protocol during bolus administration of intravenous contrast. Multiplanar CT image reconstructions including MIPs were obtained to evaluate the vascular anatomy.  CONTRAST:  OMNIPAQUE IOHEXOL 350 MG/ML SOLN  COMPARISON:  Chest radiograph November 19, 2012 and CT angiogram chest November 08, 2012  FINDINGS: There is no demonstrable pulmonary embolus. There is no thoracic aortic aneurysm or dissection.  There is a new fairly small but present pericardial effusion.  There is mild patchy infiltrate in the bases with atelectasis. Elsewhere, lungs are clear.  There is no appreciable thoracic adenopathy. There are multiple foci of coronary artery calcification.   Visualized upper abdominal structures appear unremarkable. There is degenerative change in the thoracic spine. There are no blastic or lytic bone lesions. Thyroid appears normal.  Review of the MIP images confirms the above findings.  IMPRESSION: No demonstrable pulmonary embolus.  Pericardial effusion present.  Patchy consolidation in the lung bases, primarily due to atelectasis.  No adenopathy.  Multiple foci of coronary artery calcification.   Electronically Signed   By: Bretta Bang   On: 11/19/2012 08:35   Ct Angio Chest W/cm &/or Wo Cm  11/08/2012   CLINICAL DATA:  67 year old male with chest tightness.  EXAM: CT ANGIOGRAPHY CHEST WITH  CONTRAST  TECHNIQUE: Multidetector CT imaging of the chest was performed using the standard protocol during bolus administration of intravenous contrast. Multiplanar CT image reconstructions including MIPs were obtained to evaluate the vascular anatomy.  CONTRAST:  OMNIPAQUE IOHEXOL 350 MG/ML SOLN  COMPARISON:  None.  FINDINGS: There are no filling defects within the pulmonary arteries to suggest acute pulmonary embolism. No acute findings of the aorta. No pericardial fluid. Esophagus is normal.  No axillary or supraclavicular adenopathy. No mediastinal adenopathy.  Review of the lung windows demonstrates bibasilar medial atelectasis with some peribronchial thickening and pleural thickening.  Review of the upper abdomen is unremarkable. Small hiatal hernia is present.  Review of the MIP images confirms the above findings.  IMPRESSION: 1. No evidence acute pulmonary embolism.  2. Moderate bibasilar medial atelectasis and peribronchial thickening. Differential includes atelectasis, bronchitis, aspiration pneumonitis. Bilateral pneumonia felt unlikely.   Electronically Signed   By: Genevive Bi M.D.   On: 11/08/2012 10:39   Dg Chest Portable 1 View  11/19/2012   CLINICAL DATA:  Chest pain. Shortness of breath.  EXAM: PORTABLE CHEST - 1 VIEW  COMPARISON:   11/08/2012  FINDINGS: Low lung volumes noted with linear and bandlike opacities in the lower lobes. Mildly enlarged cardiopericardial silhouette. No edema.  IMPRESSION: 1. Bandlike opacities in the lung bases, favoring atelectasis based on morphology, although pneumonia is not excluded. 2. Cardiomegaly, without edema.   Electronically Signed   By: Herbie Baltimore   On: 11/19/2012 07:16   Dg Chest Portable 1 View  11/08/2012   CLINICAL DATA:  Chest pain.  EXAM: PORTABLE CHEST - 1 VIEW  COMPARISON:  None.  FINDINGS: The heart size and mediastinal contours are within normal limits when accounting for rightward rotation. Both lungs are clear. The visualized skeletal structures are unremarkable.  IMPRESSION: No active disease.   Electronically Signed   By: Tiburcio Pea   On: 11/08/2012 06:34    Microbiology: Recent Results (from the past 240 hour(s))  CULTURE, BLOOD (ROUTINE X 2)     Status: None   Collection Time    11/19/12  9:10 AM      Result Value Range Status   Specimen Description BLOOD LEFT HAND   Final   Special Requests BOTTLES DRAWN AEROBIC ONLY 10CC   Final   Culture  Setup Time     Final   Value: 11/19/2012 13:12     Performed at [facility]   Culture     Final   Value:        BLOOD CULTURE RECEIVED NO GROWTH TO DATE CULTURE WILL BE HELD FOR 5 DAYS BEFORE ISSUING A FINAL NEGATIVE REPORT     Performed at [facility]   Report Status PENDING   Incomplete  CULTURE, BLOOD (ROUTINE X 2)     Status: None   Collection Time    11/19/12  9:23 AM      Result Value Range Status   Specimen Description BLOOD RIGHT ANTECUBITAL   Final   Special Requests BOTTLES DRAWN AEROBIC ONLY 10CC   Final   Culture  Setup Time     Final   Value: 11/19/2012 13:12     Performed at [facility]   Culture     Final   Value:        BLOOD CULTURE RECEIVED NO GROWTH TO DATE CULTURE WILL BE HELD FOR 5 DAYS BEFORE ISSUING A FINAL NEGATIVE REPORT     Performed at [facility]    Report Status PENDING   Incomplete  CULTURE, BLOOD (ROUTINE X 2)     Status: None   Collection Time    11/19/12  1:48 PM      Result Value Range Status   Specimen Description BLOOD RIGHT HAND   Final   Special Requests BOTTLES DRAWN AEROBIC ONLY 3CC   Final   Culture  Setup Time     Final   Value: 11/19/2012 20:54     Performed at [facility]   Culture     Final   Value:        BLOOD CULTURE RECEIVED NO GROWTH TO DATE CULTURE WILL BE HELD FOR 5 DAYS BEFORE ISSUING A FINAL NEGATIVE REPORT     Performed at [facility]   Report Status PENDING   Incomplete  CULTURE, BLOOD (ROUTINE X 2)     Status: None   Collection Time    11/19/12  1:57 PM      Result Value Range Status   Specimen Description BLOOD RIGHT FOREARM   Final   Special Requests BOTTLES DRAWN AEROBIC ONLY 5CC   Final   Culture  Setup Time     Final   Value: 11/19/2012 20:55     Performed at [facility]   Culture     Final   Value:        BLOOD CULTURE RECEIVED NO GROWTH TO DATE CULTURE WILL BE HELD FOR 5 DAYS BEFORE ISSUING A FINAL NEGATIVE REPORT     Performed at [facility]   Report Status PENDING   Incomplete     Labs: Basic Metabolic Panel:  Recent Labs Lab 11/19/12 0735 11/19/12 1359 11/20/12 0530 11/21/12 0209 11/22/12 0435  NA 135  --  137 138 137  K 4.0  --  4.1 3.8 4.1  CL 96  --  101 102 98  CO2 24  --  25 24 23   GLUCOSE 223*  --  179* 192* 197*  BUN 17  --  10 12 8   CREATININE 1.38* 1.02 0.81 0.85 0.86  CALCIUM 9.9  --  9.7 9.2 9.7  MG  --  1.7  --   --   --   PHOS  --  3.7  --   --   --    Liver Function Tests:  Recent Labs Lab 11/20/12 0530  AST 26  ALT 38  ALKPHOS 91  BILITOT 0.6  PROT 7.6  ALBUMIN 3.6   No results found for this basename: LIPASE, AMYLASE,  in the last 168 hours No results found for this basename: AMMONIA,  in the last 168 hours CBC:  Recent Labs Lab 11/19/12 0735 11/19/12 1359 11/20/12 0530 11/21/12 0209 11/22/12 0435   WBC 23.1* 22.1* 15.6* 12.9* 11.6*  NEUTROABS 21.0*  --   --   --   --   HGB 14.5 13.0 14.2 13.4 14.1  HCT 40.8 35.4* 39.0 37.0* 39.0  MCV 88.5 88.1 88.2 88.3 87.2  PLT 545* 449* 449* 420* 478*   Cardiac Enzymes:  Recent Labs Lab 11/20/12 1344 11/20/12 1920 11/21/12 0209  TROPONINI <0.30 <0.30 <0.30   BNP: BNP (last 3 results)  Recent Labs  11/08/12 0606 11/19/12 1359  PROBNP 228.7* 108.8   CBG:  Recent Labs Lab 11/21/12 0746 11/21/12 1157 11/21/12 1750 11/21/12 2244 11/22/12 0925  GLUCAP 173* 161* 133* 179* 269*       Signed:  WALSH,CATHERINE  [facility] 11/22/2012,  10:11 AM

## 2012-11-25 LAB — CULTURE, BLOOD (ROUTINE X 2)
Culture: NO GROWTH
Culture: NO GROWTH
Culture: NO GROWTH

## 2013-03-28 ENCOUNTER — Ambulatory Visit: Payer: Self-pay | Admitting: Neurology

## 2013-04-21 ENCOUNTER — Other Ambulatory Visit: Payer: Self-pay

## 2013-04-21 MED ORDER — LAMOTRIGINE ER 50 MG PO TB24: 50.0000 mg | ORAL_TABLET | Freq: Two times a day (BID) | ORAL | Status: DC

## 2013-04-21 NOTE — Telephone Encounter (Signed)
Former [facility] patient assigned to Dr Brett Fairy.  Has appt in August

## 2013-09-26 ENCOUNTER — Encounter: Payer: Self-pay | Admitting: *Deleted

## 2013-09-27 ENCOUNTER — Encounter: Payer: Self-pay | Admitting: Neurology

## 2013-09-27 ENCOUNTER — Ambulatory Visit (INDEPENDENT_AMBULATORY_CARE_PROVIDER_SITE_OTHER): Payer: 59 | Admitting: Neurology

## 2013-09-27 VITALS — BP 132/84 | HR 86 | Resp 15 | Ht 69.0 in | Wt 181.0 lb

## 2013-09-27 DIAGNOSIS — R569 Unspecified convulsions: Secondary | ICD-10-CM

## 2013-09-27 DIAGNOSIS — Z760 Encounter for issue of repeat prescription: Secondary | ICD-10-CM

## 2013-09-27 HISTORY — DX: Unspecified convulsions: R56.9

## 2013-09-27 MED ORDER — LAMOTRIGINE ER 50 MG PO TB24: 50.0000 mg | ORAL_TABLET | Freq: Two times a day (BID) | ORAL | Status: AC

## 2013-09-27 NOTE — Progress Notes (Signed)
Provider:  Larey Seat, M D  Referring Provider: Orpah Melter, MD Primary Care Physician:  Orpah Melter, MD  Chief Complaint  Patient presents with  . Follow-up    Room 10  . Seizures    HPI:  Joshua Randall is a 68 y.o. male , who is seen here as a referral/ revisit  from Dr. Olen Pel for seizure follow up.  Mr. Joshua Randall, a Caucasian, left-handed, a married  68 year old male reports 1 single seizure event. It is described as occurring in the year 2006 doing a visit and a veterinarian's office. Apparently he clenched his teeth but there was no tonic-clonic activity described no urinary or bowel incontinence took place. For about 6 hours after the spell he had a headache and he was seen at the [facility] in [facility], loss carina. The patient had felt lightheaded before sitting down and he seemed to have been unconscious for about 45 second duration.  EEG and MRI at [facility] were normal but he was placed on the motor region 100 mg twice a day and then taper to 30 mg twice daily. This is unusual for a patient who had nor recurrent seizures and only one single possible seizure event. He has denied having any auras of a feeling of other seizures ever since. He remains independent in his activities of daily living he does not have headaches,  auditory or visual auras , loss of vision, double vision, swallowing problems etc. 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  He enjoys camping.  In 2013, Dr. Erling Cruz  Had ordered a DEXA bone scan which showed osteopenia of his left hip- but a normal spine.  He was placed on calcium supplements as well as vitamin D supplements. In December 2012, CMET  blood studies were normal,  metabolic disorder.  His lamotrigine levels were normal.  He has diabetes mellitus and his capillary blood glucose ranges between 120 and 140 in the morning and 80-100 after supper. He has no focal neurological signs.  He has no history  of concussion, contusion, brain surgery.   He works for [employer].     Review of Systems: Out of a complete 14 system review, the patient complains of only the following symptoms, and all other reviewed systems are negative. Awaiting retirement on May 25 2014.   History   Social History  . Marital Status: Married    Spouse Name: April    Number of Children: 3  . Years of Education: 14   Occupational History  .     Social History Main Topics  . Smoking status: Never Smoker   . Smokeless tobacco: Never Used  . Alcohol Use: Yes     Comment: 3-4 per week  . Drug Use: No  . Sexual Activity: Not on file   Other Topics Concern  . Not on file   Social History Narrative   Patient is married (April) and lives at home with his wife.   Patient has three adult children.   Patient drinks 2-3 cups of caffeine daily.   Patient is left-handed.   Patient is working full-time.   Patient has an Associates degree.                Family History  Problem Relation Age of Onset  . Heart Problems Mother   . Diabetes Father   . Hypertension Father   . Hypertension Brother  Past Medical History  Diagnosis Date  . Diabetes mellitus without complication   . Hypertension   . GERD (gastroesophageal reflux disease)   . Seizures   . Hyperlipidemia     Past Surgical History  Procedure Laterality Date  . Cholecystectomy  2008  . Tonsillectomy      Current Outpatient Prescriptions  Medication Sig Dispense Refill  . aspirin 81 MG tablet Take 81 mg by mouth daily.      . Calcium Carbonate-Vitamin D (CALCIUM-D PO) Take 2 tablets by mouth daily. 630 mg      . esomeprazole (NEXIUM) 40 MG capsule Take 40 mg by mouth at bedtime.      . felodipine (PLENDIL) 5 MG 24 hr tablet Take 5 mg by mouth 2 (two) times daily.      Marland Kitchen glyBURIDE (DIABETA) 1.25 MG tablet Take 1.25 mg by mouth 2 (two) times daily.      . LamoTRIgine 50 MG TB24 Take 1 tablet (50 mg total) by mouth 2 (two) times  daily.  180 tablet  1  . metFORMIN (GLUCOPHAGE) 1000 MG tablet Take 1,000 mg by mouth 2 (two) times daily.      . metoprolol succinate (TOPROL-XL) 25 MG 24 hr tablet Take 25 mg by mouth daily.      . Multiple Vitamins-Minerals (MULTIVITAMIN PO) Take 1 tablet by mouth daily.      . simvastatin (ZOCOR) 40 MG tablet Take 40 mg by mouth every evening.      . tadalafil (CIALIS) 20 MG tablet Take 20 mg by mouth daily as needed for erectile dysfunction.       No current facility-administered medications for this visit.    Allergies as of 09/27/2013  . (No Known Allergies)    Vitals: BP 132/84  Pulse 86  Resp 15  Ht 5\' 9"  (1.753 m)  Wt 181 lb (82.101 kg)  BMI 26.72 kg/m2 Last Weight:  Wt Readings from Last 1 Encounters:  09/27/13 181 lb (82.101 kg)   Last Height:   Ht Readings from Last 1 Encounters:  09/27/13 5\' 9"  (1.753 m)    Physical exam:  General: The patient is awake, alert and appears not in acute distress. The patient is well groomed. Head: Normocephalic, atraumatic. Neck is supple. Mallampati 2 , neck circumference: 14 , mild retrognathia.  Cardiovascular:  Regular rate and rhythm, without  murmurs or carotid bruit, and without distended neck veins. Respiratory: Lungs are clear to auscultation. Skin:  Without evidence of edema, or rash Trunk: BMI is normal.  Neurologic exam : The patient is awake and alert, oriented to place and time.  Memory subjective  described as intact.  There is a normal attention span & concentration ability. Speech is fluent without dysarthria, dysphonia or aphasia. Mood and affect are appropriate.  Cranial nerves: Pupils are equal and briskly reactive to light. Funduscopic exam without evidence of pallor or edema.  Extraocular movements  in vertical and horizontal planes intact and without nystagmus.  Visual fields by finger perimetry are intact. Hearing to finger rub intact.  Facial sensation intact to fine touch. Facial motor strength is  symmetric and tongue and uvula move midline. Tongue protrusion into either cheek is normal. Shoulder shrug is normal.   Motor exam:  Normal tone ,muscle bulk and symmetric  strength in all extremities.  Sensory:  Fine touch, pinprick and vibration were tested in all extremities. Proprioception was normal.  Coordination: Rapid alternating movements in the fingers/hands were normal. Finger-to-nose maneuver  normal  without evidence of ataxia, dysmetria or tremor.  Gait and station: Patient walks without assistive device and is able unassisted to climb up to the exam table. Strength within normal limits. Stance is stable and normal. Tandem gait is unfragmented. Romberg testing is negative   Deep tendon reflexes: in the  upper and lower extremities are symmetric and intact. Babinski maneuver response is downgoing.   Assessment:  After physical and neurologic examination, review of laboratory studies, imaging, neurophysiology testing and pre-existing records, assessment is that of :     Plan:  Treatment plan and additional workup :     Larey Seat MD 09/27/2013

## 2013-09-27 NOTE — Patient Instructions (Signed)
Lamotrigine tablets What is this medicine? LAMOTRIGINE (la MOE tri jeen) is used to control seizures in adults and children with epilepsy and Lennox-Gastaut syndrome. It is also used in adults to treat bipolar disorder. This medicine may be used for other purposes; ask your health care provider or pharmacist if you have questions. COMMON BRAND NAME(S): Lamictal What should I tell my health care provider before I take this medicine? They need to know if you have any of these conditions: -a history of depression or bipolar disorder -aseptic meningitis during prior use of lamotrigine -folate deficiency -kidney disease -liver disease -suicidal thoughts, plans, or attempt; a previous suicide attempt by you or a family member -an unusual or allergic reaction to lamotrigine or other seizure medications, other medicines, foods, dyes, or preservatives -pregnant or trying to get pregnant -breast-feeding How should I use this medicine? Take this medicine by mouth with a glass of water. Follow the directions on the prescription label. Do not chew these tablets. If this medicine upsets your stomach, take it with food or milk. Take your doses at regular intervals. Do not take your medicine more often than directed. A special MedGuide will be given to you by the pharmacist with each new prescription and refill. Be sure to read this information carefully each time. Talk to your pediatrician regarding the use of this medicine in children. While this drug may be prescribed for children as young as 2 years for selected conditions, precautions do apply. Overdosage: If you think you have taken too much of this medicine contact a poison control center or emergency room at once. NOTE: This medicine is only for you. Do not share this medicine with others. What if I miss a dose? If you miss a dose, take it as soon as you can. If it is almost time for your next dose, take only that dose. Do not take double or extra  doses. What may interact with this medicine? -carbamazepine -male hormones, including contraceptive or birth control pills -methotrexate -phenobarbital -phenytoin -primidone -pyrimethamine -rifampin -trimethoprim -valproic acid This list may not describe all possible interactions. Give your health care provider a list of all the medicines, herbs, non-prescription drugs, or dietary supplements you use. Also tell them if you smoke, drink alcohol, or use illegal drugs. Some items may interact with your medicine. What should I watch for while using this medicine? Visit your doctor or health care professional for regular checks on your progress. If you take this medicine for seizures, wear a Medic Alert bracelet or necklace. Carry an identification card with information about your condition, medicines, and doctor or health care professional. It is important to take this medicine exactly as directed. When first starting treatment, your dose will need to be adjusted slowly. It may take weeks or months before your dose is stable. You should contact your doctor or health care professional if your seizures get worse or if you have any new types of seizures. Do not stop taking this medicine unless instructed by your doctor or health care professional. Stopping your medicine suddenly can increase your seizures or their severity. Contact your doctor or health care professional right away if you develop a rash while taking this medicine. Rashes may be very severe and sometimes require treatment in the hospital. Deaths from rashes have occurred. Serious rashes occur more often in children than adults taking this medicine. It is more common for these serious rashes to occur during the first 2 months of treatment, but a rash can   occur at any time. You may get drowsy, dizzy, or have blurred vision. Do not drive, use machinery, or do anything that needs mental alertness until you know how this medicine affects you.  To reduce dizzy or fainting spells, do not sit or stand up quickly, especially if you are an older patient. Alcohol can increase drowsiness and dizziness. Avoid alcoholic drinks. If you are taking this medicine for bipolar disorder, it is important to report any changes in your mood to your doctor or health care professional. If your condition gets worse, you get mentally depressed, feel very hyperactive or manic, have difficulty sleeping, or have thoughts of hurting yourself or committing suicide, you need to get help from your health care professional right away. If you are a caregiver for someone taking this medicine for bipolar disorder, you should also report these behavioral changes right away. The use of this medicine may increase the chance of suicidal thoughts or actions. Pay special attention to how you are responding while on this medicine. Your mouth may get dry. Chewing sugarless gum or sucking hard candy, and drinking plenty of water may help. Contact your doctor if the problem does not go away or is severe. Women who become pregnant while using this medicine may enroll in the [place] Pregnancy Registry by calling [phone]. This registry collects information about the safety of antiepileptic drug use during pregnancy. What side effects may I notice from receiving this medicine? Side effects you should report to your doctor or health care professional as soon as possible: -allergic reactions like skin rash, itching or hives, swelling of the face, lips, or tongue -blurred or double vision -difficulty walking or controlling muscle movements -fever -headache, stiff neck, and sensitivity to light -painful sores in the mouth, eyes, or nose -redness, blistering, peeling or loosening of the skin, including inside the mouth -severe muscle pain -swollen lymph glands -uncontrollable eye movements -unusual bruising or bleeding -unusually weak or  tired -vomiting -worsening of mood, thoughts or actions of suicide or dying -yellowing of the eyes or skin Side effects that usually do not require medical attention (report to your doctor or health care professional if they continue or are bothersome): -diarrhea or constipation -difficulty sleeping -nausea -tremors This list may not describe all possible side effects. Call your doctor for medical advice about side effects. You may report side effects to FDA at 1-800-FDA-1088. Where should I keep my medicine? Keep out of reach of children. Store at room temperature between 15 and 30 degrees C (59 and 86 degrees F). Throw away any unused medicine after the expiration date. NOTE: This sheet is a summary. It may not cover all possible information. If you have questions about this medicine, talk to your doctor, pharmacist, or health care provider.  2015, Elsevier/Gold Standard. (2010-02-13 12:21:39)

## 2014-01-04 ENCOUNTER — Telehealth: Payer: Self-pay | Admitting: Neurology

## 2014-01-04 ENCOUNTER — Encounter: Payer: Self-pay | Admitting: Neurology

## 2014-01-04 NOTE — Telephone Encounter (Signed)
Printed and mailed letter with adjusted appointment time on 11/16/14 per Dr. Edwena Felty schedule.

## 2014-02-03 IMAGING — CT CT ANGIO CHEST
2 of 8 series · 18 of 46 positions shown · IV contrast (APPLIED)
Comparison: Chest radiograph November 19, 2012 and CT angiogram
chest November 08, 2012

CLINICAL DATA: Chest pain

EXAM:
CT ANGIOGRAPHY CHEST WITH CONTRAST
TECHNIQUE: Multidetector CT imaging of the chest was performed using the
standard protocol during bolus administration of intravenous
contrast. Multiplanar CT image reconstructions including MIPs were
obtained to evaluate the vascular anatomy.
CONTRAST:  100mL OMNIPAQUE IOHEXOL 350 MG/ML SOLN

[Series 5: thins · axial · 0.74mm/px · z∈[+1290,+1500]mm · 15 of 234 slices shown]
[im 12/234  lung]
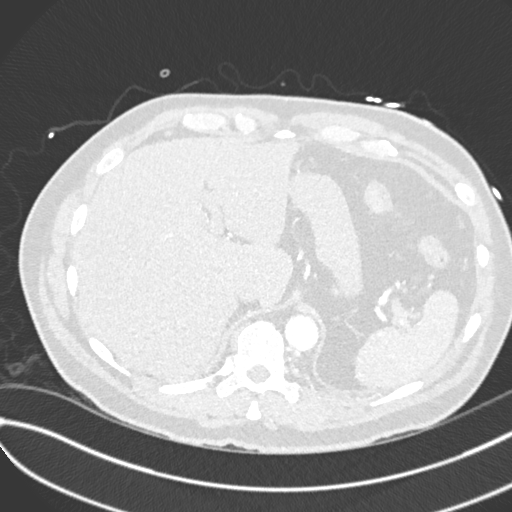
[im 34/234  soft-tissue]
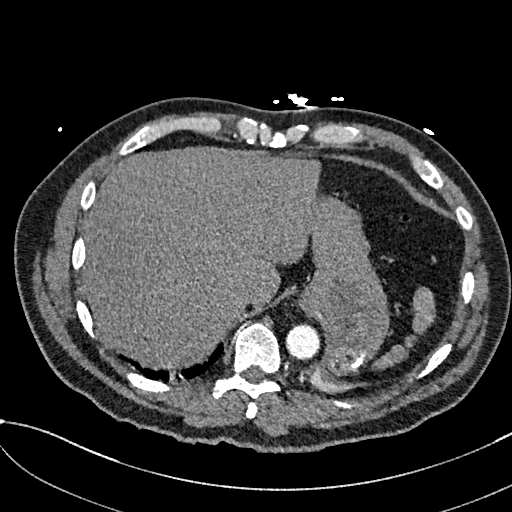
[im 45/234  lung]
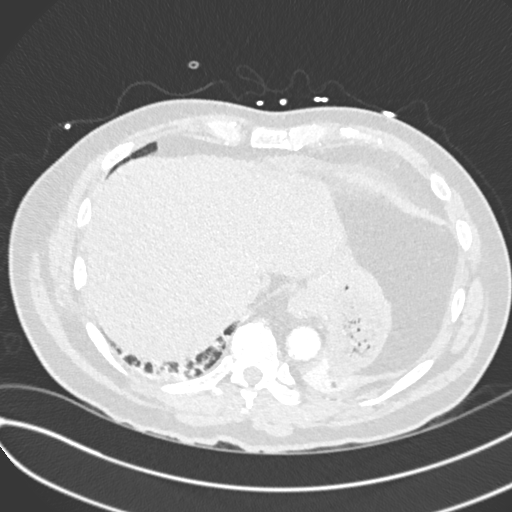
[im 56/234  soft-tissue]
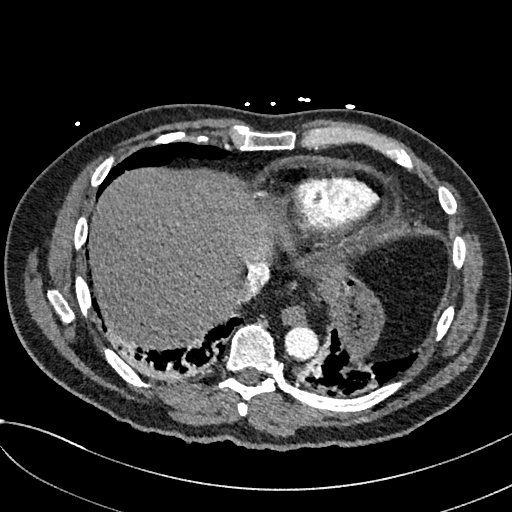
[im 78/234  lung]
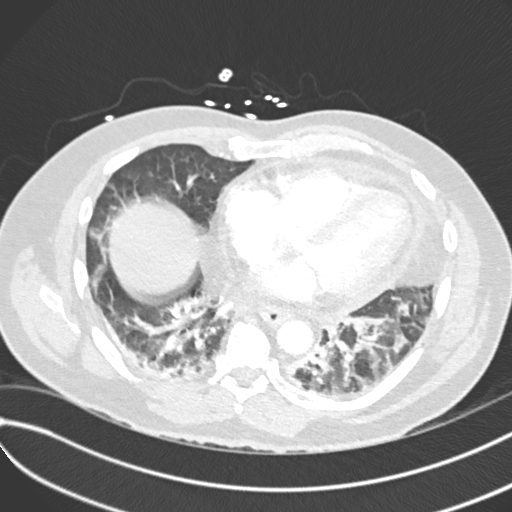
[im 89/234  soft-tissue]
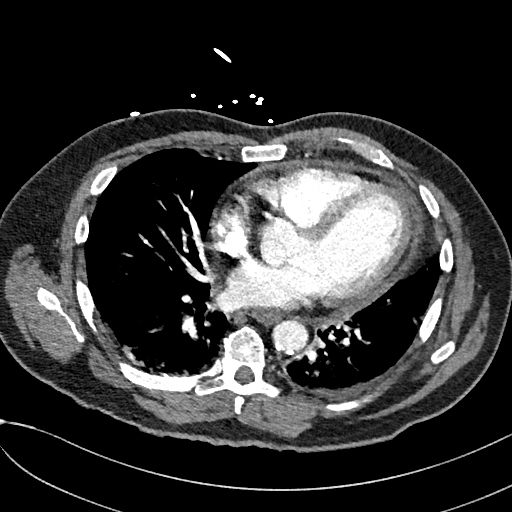
[im 100/234  lung]
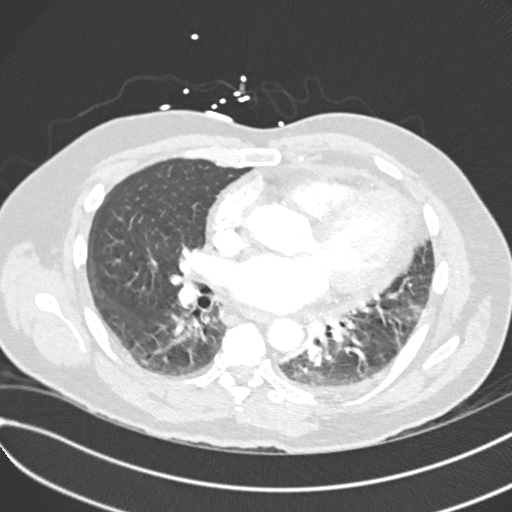
[im 123/234  soft-tissue]
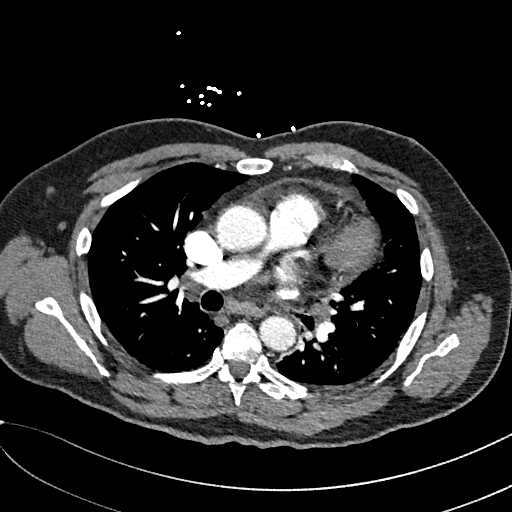
[im 134/234  lung]
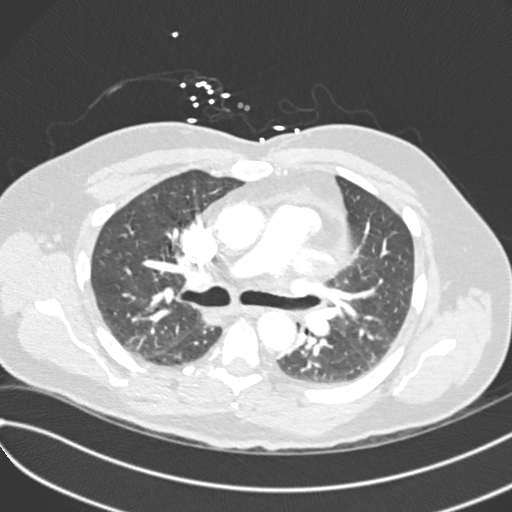
[im 145/234  soft-tissue]
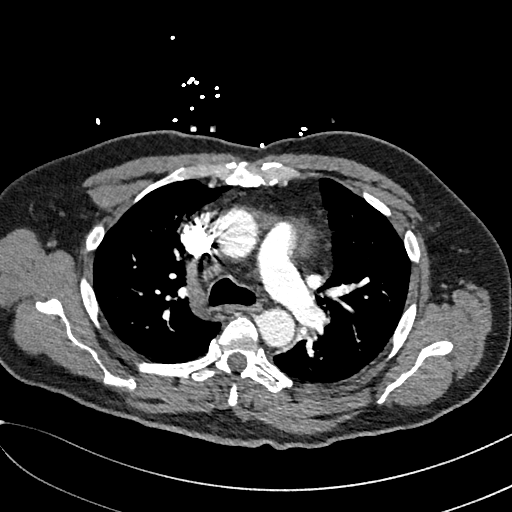
[im 167/234  lung]
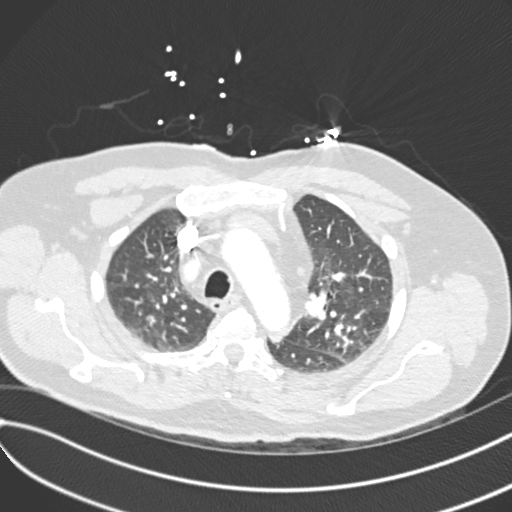
[im 178/234  soft-tissue]
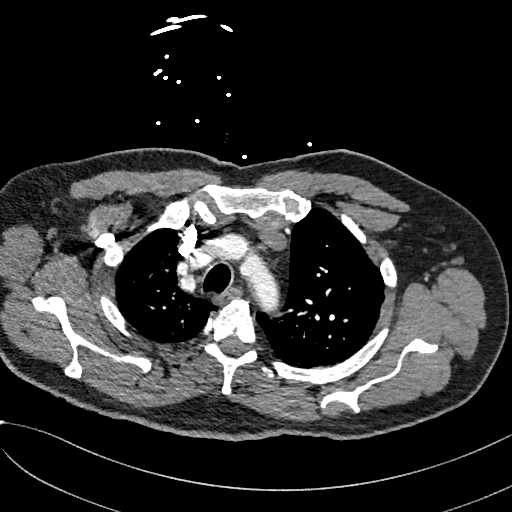
[im 189/234  lung]
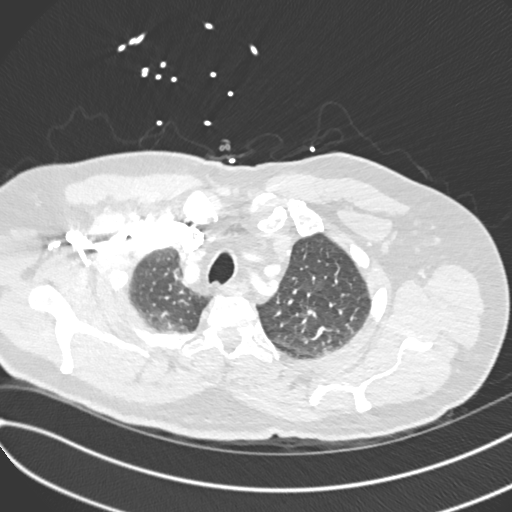
[im 211/234  soft-tissue]
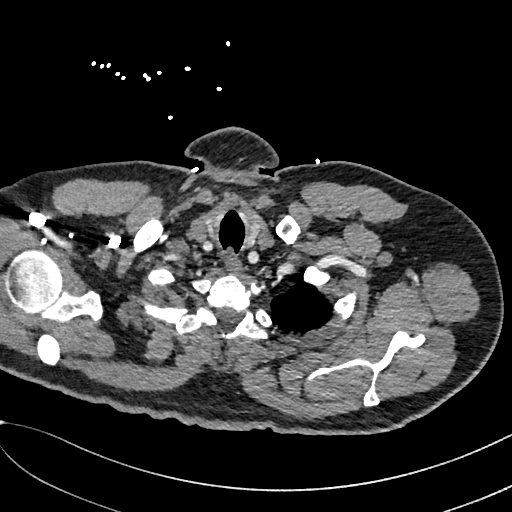
[im 222/234  lung]
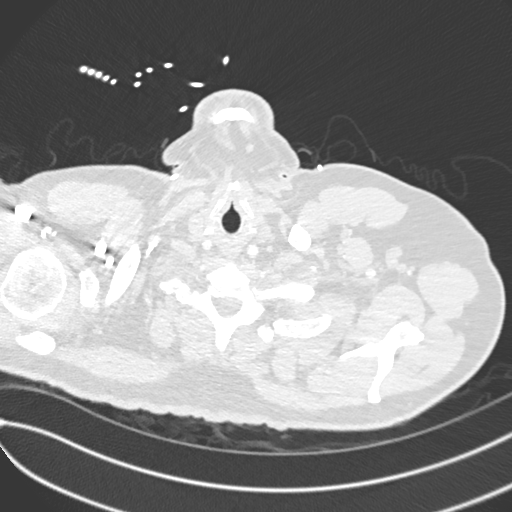

[Series 7: coronal mpr · coronal · 0.51mm/px · 3 of 117 slices shown]
[im 30/117  soft-tissue]
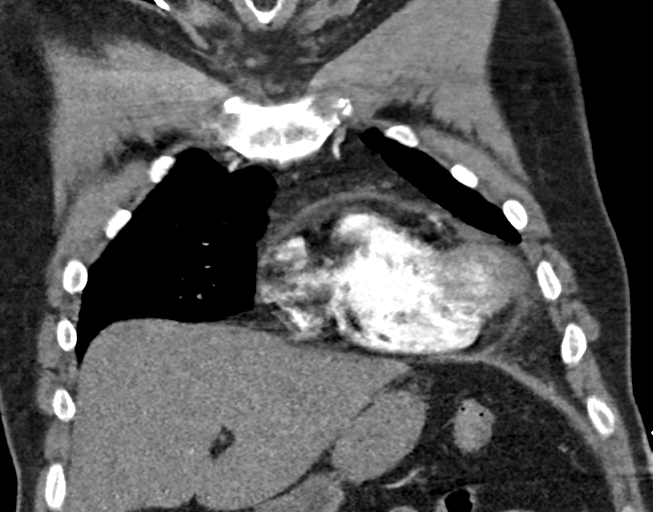
[im 59/117  soft-tissue]
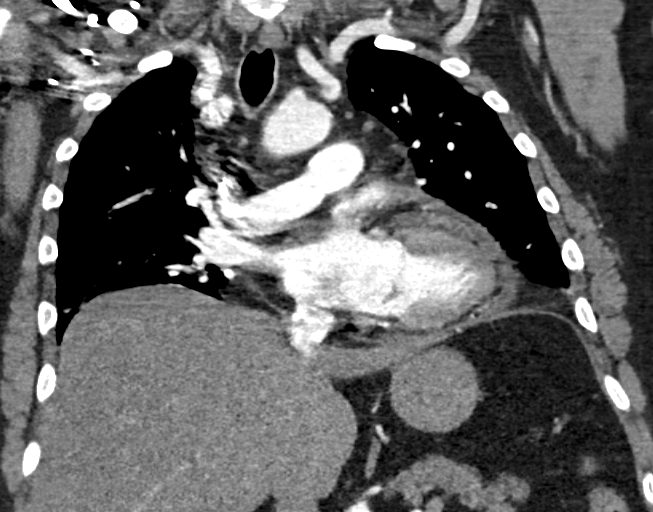
[im 88/117  soft-tissue]
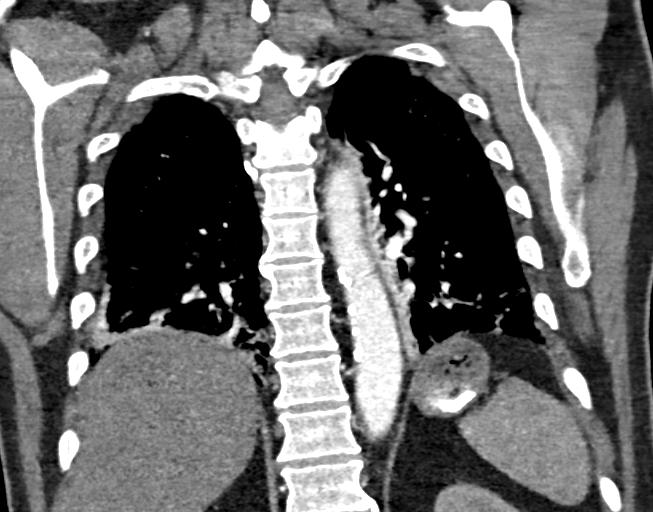

[18 of 46 positions shown; findings below may reference images not displayed]

FINDINGS: There is no demonstrable pulmonary embolus. There is no thoracic
aortic aneurysm or dissection.

There is a new fairly small but present pericardial effusion.

There is mild patchy infiltrate in the bases with atelectasis.
Elsewhere, lungs are clear.

There is no appreciable thoracic adenopathy. There are multiple foci
of coronary artery calcification.

Visualized upper abdominal structures appear unremarkable. There is
degenerative change in the thoracic spine. There are no blastic or
lytic bone lesions. Thyroid appears normal.

Review of the MIP images confirms the above findings.
IMPRESSION: No demonstrable pulmonary embolus.

Pericardial effusion present.

Patchy consolidation in the lung bases, primarily due to
atelectasis.

No adenopathy.

Multiple foci of coronary artery calcification.

## 2014-02-03 IMAGING — CR DG CHEST 1V PORT
1 series · 1 of 1 positions shown · non-contrast
Comparison: 11/08/2012

CLINICAL DATA: Chest pain. Shortness of breath.

EXAM:
PORTABLE CHEST - 1 VIEW

[AP]
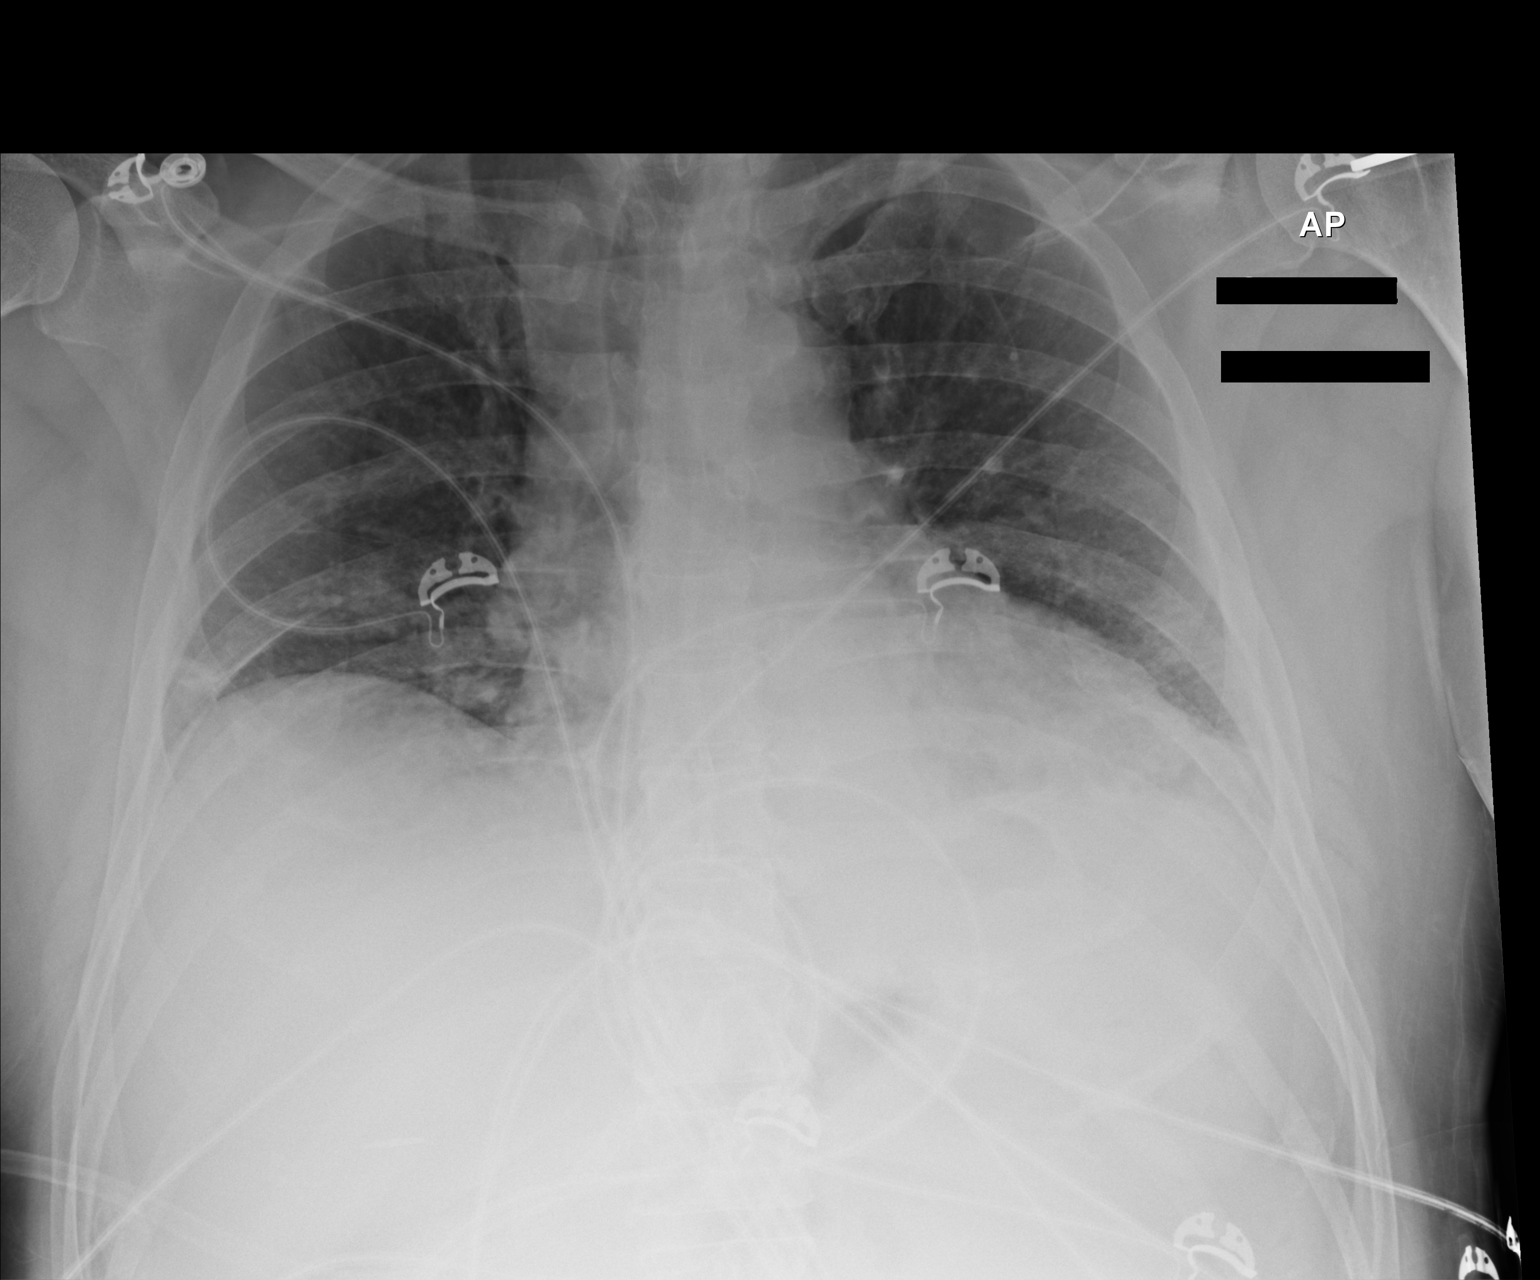

[1 of 1 positions shown; findings below may reference images not displayed]

FINDINGS: Low lung volumes noted with linear and bandlike opacities in the
lower lobes. Mildly enlarged cardiopericardial silhouette. No edema.
IMPRESSION: 1. Bandlike opacities in the lung bases, favoring atelectasis based
on morphology, although pneumonia is not excluded.
2. Cardiomegaly, without edema.

## 2014-11-16 ENCOUNTER — Ambulatory Visit: Payer: 59 | Admitting: Neurology
